# Patient Record
Sex: Male | Born: 1985 | Race: Black or African American | Hispanic: No | Marital: Single | Smoking: Current every day smoker
Health system: Southern US, Community
[De-identification: ages and names within clinical notes are randomized; demographics above are authoritative.]

## PROBLEM LIST (undated history)

## (undated) ENCOUNTER — Emergency Department (HOSPITAL_COMMUNITY): Admission: EM | Source: Home / Self Care

## (undated) DIAGNOSIS — T148XXA Other injury of unspecified body region, initial encounter: Secondary | ICD-10-CM

## (undated) DIAGNOSIS — Z86718 Personal history of other venous thrombosis and embolism: Secondary | ICD-10-CM

## (undated) DIAGNOSIS — R519 Headache, unspecified: Secondary | ICD-10-CM

## (undated) DIAGNOSIS — M109 Gout, unspecified: Secondary | ICD-10-CM

## (undated) DIAGNOSIS — I82409 Acute embolism and thrombosis of unspecified deep veins of unspecified lower extremity: Secondary | ICD-10-CM

## (undated) DIAGNOSIS — Z973 Presence of spectacles and contact lenses: Secondary | ICD-10-CM

## (undated) DIAGNOSIS — I10 Essential (primary) hypertension: Secondary | ICD-10-CM

## (undated) HISTORY — PX: WISDOM TOOTH EXTRACTION: SHX21

---

## 2020-04-10 ENCOUNTER — Other Ambulatory Visit: Payer: Self-pay

## 2020-04-10 ENCOUNTER — Ambulatory Visit (INDEPENDENT_AMBULATORY_CARE_PROVIDER_SITE_OTHER): Payer: Medicaid Other

## 2020-04-10 ENCOUNTER — Ambulatory Visit (HOSPITAL_COMMUNITY)
Admission: EM | Admit: 2020-04-10 | Discharge: 2020-04-10 | Disposition: A | Discharge status: Home / Self Care | Payer: Medicaid Other | Attending: Physician Assistant | Admitting: Physician Assistant

## 2020-04-10 ENCOUNTER — Encounter (HOSPITAL_COMMUNITY): Payer: Self-pay

## 2020-04-10 DIAGNOSIS — S6991XA Unspecified injury of right wrist, hand and finger(s), initial encounter: Secondary | ICD-10-CM | POA: Diagnosis not present

## 2020-04-10 DIAGNOSIS — S62614A Displaced fracture of proximal phalanx of right ring finger, initial encounter for closed fracture: Secondary | ICD-10-CM | POA: Diagnosis not present

## 2020-04-10 HISTORY — DX: Gout, unspecified: M10.9

## 2020-04-10 HISTORY — DX: Essential (primary) hypertension: I10

## 2020-04-10 MED ORDER — ACETAMINOPHEN 325 MG PO TABS
ORAL_TABLET | ORAL | Status: AC
  Filled 2020-04-10: qty 3

## 2020-04-10 MED ORDER — ACETAMINOPHEN 500 MG PO TABS: 1000.0000 mg | ORAL_TABLET | Freq: Three times a day (TID) | ORAL | 0 refills | Status: DC | PRN

## 2020-04-10 MED ORDER — ACETAMINOPHEN 325 MG PO TABS
975.0000 mg | ORAL_TABLET | Freq: Once | ORAL | Status: AC
  Administered 2020-04-10: 975 mg via ORAL

## 2020-04-10 MED ORDER — IBUPROFEN 600 MG PO TABS: 600.0000 mg | ORAL_TABLET | Freq: Four times a day (QID) | ORAL | 0 refills | Status: DC | PRN

## 2020-04-10 NOTE — ED Notes (Signed)
Ortho paged about splint order.

## 2020-04-10 NOTE — ED Provider Notes (Signed)
[facility]    CSN: [id number] Arrival date & time: 04/10/20  1606      History   Chief Complaint Chief Complaint  Patient presents with  . Hand Pain    HPI Jeffery Acosta is a 34 y.o. male.   Patient reports for right hand pain after punching a wall on Sunday, 4 days ago.  He reports significant swelling top of his hand and bruising on his palm.  He states he had to relocate his ring finger as he thought it was dislocated."  Look like a hanging nail".  Reports since then persistent pain.  Decreased mobility of the fingers.  Denies numbness or tingling.     Past Medical History:  Diagnosis Date  . Gout   . Hypertension     There are no problems to display for this patient.   History reviewed. No pertinent surgical history.     Home Medications    Prior to Admission medications   Medication Sig Start Date End Date Taking? Authorizing Provider  acetaminophen (TYLENOL) 500 MG tablet Take 2 tablets (1,000 mg total) by mouth every 8 (eight) hours as needed. 04/10/20   Cierria Height, Veryl Speak, PA-C  ibuprofen (ADVIL) 600 MG tablet Take 1 tablet (600 mg total) by mouth every 6 (six) hours as needed. 04/10/20   Jahne Krukowski, Veryl Speak, PA-C    Family History Family History  Family history unknown: Yes    Social History Social History   Tobacco Use  . Smoking status: Not on file  Substance Use Topics  . Alcohol use: Not on file  . Drug use: Not on file     Allergies   Patient has no known allergies.   Review of Systems Review of Systems   Physical Exam Triage Vital Signs ED Triage Vitals [04/10/20 1815]  Enc Vitals Group     BP (!) 145/114     Pulse Rate 75     Resp 16     Temp 97.8 F (36.6 C)     Temp src      SpO2 99 %     Weight      Height      Head Circumference      Peak Flow      Pain Score 6     Pain Loc      Pain Edu?      Excl. in GC?    No data found.  Updated Vital Signs BP (!) 145/114 Comment: has not had meds in "awhile  Pulse  75   Temp 97.8 F (36.6 C)   Resp 16   SpO2 99%   Visual Acuity Right Eye Distance:   Left Eye Distance:   Bilateral Distance:    Right Eye Near:   Left Eye Near:    Bilateral Near:     Physical Exam Vitals and nursing note reviewed.  Constitutional:      Appearance: Normal appearance.  Cardiovascular:     Rate and Rhythm: Normal rate.  Musculoskeletal:     Comments: Right hand with dorsal swellings more prominently over the fourth and fifth metacarpals and metacarpal heads.  Significant tenderness at the heads of the fourth and fifth metacarpals.  No obvious deformity, however possible shortening.  Patient able to slightly move fourth and fifth digits, unable to make a fist, primarily due to pain.  No significant second or third metacarpal tenderness.  Joints of the phalanges appear in line without dislocation.  Cap refill less  than 2 seconds.  Sensation is intact.  Neurological:     Mental Status: He is alert.      UC Treatments / Results  Labs (all labs ordered are listed, but only abnormal results are displayed) Labs Reviewed - No data to display  EKG   Radiology DG Hand Complete Right  Result Date: 04/10/2020 CLINICAL DATA:  34 year old male pints a wall. EXAM: RIGHT HAND - COMPLETE 3+ VIEW COMPARISON:  None. FINDINGS: There is an angulated fracture of the base of the proximal phalanx of the fourth digit with dorsal angulation of the distal fracture fragment. No other acute fracture. There is soft tissue swelling over the MCP joints. No radiopaque foreign object or soft tissue gas. IMPRESSION: Angulated fracture of the base of the proximal phalanx of the fourth digit. Electronically Signed   By: Elgie Collard M.D.   On: 04/10/2020 19:29    Procedures Procedures (including critical care time)  Medications Ordered in UC Medications  acetaminophen (TYLENOL) tablet 975 mg (975 mg Oral Given 04/10/20 1952)    Initial Impression / Assessment and Plan / UC Course    I have reviewed the triage vital signs and the nursing notes.  Pertinent labs & imaging results that were available during my care of the patient were reviewed by me and considered in my medical decision making (see chart for details).     #Displaced fracture of proximal fourth phalange on right hand Patient is 34 year old male with displaced fracture of the fourth digit on the right hand.  Neurovascularly intact.  Will place in volar resting splint and have him follow-up with hand tomorrow.  Also discussed walk-in clinic at [facility] as there the on-call hand group.  Patient verbalized understanding plan of care.  Final Clinical Impressions(s) / UC Diagnoses   Final diagnoses:  Closed displaced fracture of proximal phalanx of right ring finger, initial encounter     Discharge Instructions     Wear the splint at all times until you have follow up with the hand specialist  Call tomorrow for evaluation ASAP - there is also walk in hours 8am to 8PM at the [facility] Location  Take tylenol and ibuprofen as prescribed    ED Prescriptions    Medication Sig Dispense Auth. Provider   acetaminophen (TYLENOL) 500 MG tablet Take 2 tablets (1,000 mg total) by mouth every 8 (eight) hours as needed. 30 tablet Xareni Kelch, Veryl Speak, PA-C   ibuprofen (ADVIL) 600 MG tablet Take 1 tablet (600 mg total) by mouth every 6 (six) hours as needed. 30 tablet Irlene Crudup, Veryl Speak, PA-C     PDMP not reviewed this encounter.   Hermelinda Medicus, PA-C 04/10/20 2037

## 2020-04-10 NOTE — ED Triage Notes (Signed)
Pt c/o right hand pain after punching wall on Sunday

## 2020-04-10 NOTE — Progress Notes (Signed)
Orthopedic Tech Progress Note Patient Details:  Jeffery Acosta [birth year] [id number]  Ortho Devices Type of Ortho Device: Volar splint Ortho Device/Splint Location: RUE Ortho Device/Splint Interventions: Ordered, Application   Post Interventions Patient Tolerated: Well Instructions Provided: Care of device, Poper ambulation with device   [name] 04/10/2020, 8:14 PM

## 2020-04-10 NOTE — Discharge Instructions (Addendum)
Wear the splint at all times until you have follow up with the hand specialist  Call tomorrow for evaluation ASAP - there is also walk in hours 8am to 8PM at the [facility] Location  Take tylenol and ibuprofen as prescribed

## 2020-04-25 ENCOUNTER — Encounter (HOSPITAL_COMMUNITY): Payer: Self-pay | Admitting: Orthopedic Surgery

## 2020-04-25 ENCOUNTER — Other Ambulatory Visit: Payer: Self-pay

## 2020-04-25 NOTE — Progress Notes (Signed)
Pt stated that he has " superficial blood clots" and has not taken his Eliquis as prescribed for more that one month. Pt stated that he only takes his BP medication "  when I have a headache or when my BP is up."  Pt stated that he " does not like to take medicine."  Nurse educated patient on the potential risks when noncompliant with prescribed medications. Nurse made pt aware that he should not stop taking medications without making his doctor(s) aware. Pt made aware that if his BP is elevated on DOS, it could delay his procedure. Again, pt encouraged to take his medication as prescribed. Pt verbalized understanding.

## 2020-04-25 NOTE — Progress Notes (Signed)
Pt denies SOB, chest pain, and being under the care of a cardiologist. Pt denies having a stress test, echo and cardiac cath. Pt denies having an EKG and chest x ray. Pt denies recent labs. Pt made aware to stop taking vitamins, fish oil and herbal medications. Do not take any NSAIDs ie: Ibuprofen, Advil, Naproxen (Aleve), Motrin, BC and Goody Powder. Pt reminded to quarantine. Pt verbalized understanding of all pre-op instructions.

## 2020-04-29 ENCOUNTER — Encounter (HOSPITAL_COMMUNITY): Payer: Self-pay

## 2020-04-29 ENCOUNTER — Ambulatory Visit (HOSPITAL_COMMUNITY)
Admission: RE | Admit: 2020-04-29 | Discharge: 2020-04-29 | Disposition: A | Discharge status: Home / Self Care | Payer: Medicaid Other | Attending: Orthopedic Surgery | Admitting: Orthopedic Surgery

## 2020-04-29 ENCOUNTER — Encounter (HOSPITAL_COMMUNITY)
Admission: RE | Disposition: A | Discharge status: Home / Self Care | Payer: Self-pay | Source: Home / Self Care | Attending: Orthopedic Surgery

## 2020-04-29 ENCOUNTER — Other Ambulatory Visit: Payer: Self-pay

## 2020-04-29 ENCOUNTER — Encounter (HOSPITAL_COMMUNITY): Payer: Self-pay | Admitting: Orthopedic Surgery

## 2020-04-29 DIAGNOSIS — X58XXXA Exposure to other specified factors, initial encounter: Secondary | ICD-10-CM | POA: Insufficient documentation

## 2020-04-29 DIAGNOSIS — Z20822 Contact with and (suspected) exposure to covid-19: Secondary | ICD-10-CM | POA: Diagnosis not present

## 2020-04-29 DIAGNOSIS — S62614A Displaced fracture of proximal phalanx of right ring finger, initial encounter for closed fracture: Secondary | ICD-10-CM | POA: Insufficient documentation

## 2020-04-29 DIAGNOSIS — Z5320 Procedure and treatment not carried out because of patient's decision for unspecified reasons: Secondary | ICD-10-CM | POA: Insufficient documentation

## 2020-04-29 HISTORY — DX: Personal history of other venous thrombosis and embolism: Z86.718

## 2020-04-29 HISTORY — DX: Other injury of unspecified body region, initial encounter: T14.8XXA

## 2020-04-29 HISTORY — DX: Headache, unspecified: R51.9

## 2020-04-29 HISTORY — DX: Presence of spectacles and contact lenses: Z97.3

## 2020-04-29 LAB — CBC
HCT: 48.7 % (ref 39.0–52.0)
Hemoglobin: 16.3 g/dL (ref 13.0–17.0)
MCH: 31.4 pg (ref 26.0–34.0)
MCHC: 33.5 g/dL (ref 30.0–36.0)
MCV: 93.8 fL (ref 80.0–100.0)
Platelets: 200 10*3/uL (ref 150–400)
RBC: 5.19 MIL/uL (ref 4.22–5.81)
RDW: 13.3 % (ref 11.5–15.5)
WBC: 5.4 10*3/uL (ref 4.0–10.5)
nRBC: 0 % (ref 0.0–0.2)

## 2020-04-29 LAB — BASIC METABOLIC PANEL
Anion gap: 9 (ref 5–15)
BUN: 15 mg/dL (ref 6–20)
CO2: 25 mmol/L (ref 22–32)
Calcium: 9.3 mg/dL (ref 8.9–10.3)
Chloride: 103 mmol/L (ref 98–111)
Creatinine, Ser: 1.12 mg/dL (ref 0.61–1.24)
GFR calc Af Amer: >60 mL/min (ref >60)
GFR calc non Af Amer: >60 mL/min (ref >60)
Glucose, Bld: 92 mg/dL (ref 70–99)
Potassium: 3.6 mmol/L (ref 3.5–5.1)
Sodium: 137 mmol/L (ref 135–145)

## 2020-04-29 LAB — SARS CORONAVIRUS 2 BY RT PCR (HOSPITAL ORDER, PERFORMED IN ~~LOC~~ HOSPITAL LAB): SARS Coronavirus 2: NEGATIVE

## 2020-04-29 SURGERY — OPEN REDUCTION INTERNAL FIXATION (ORIF) PROXIMAL PHALANX
Anesthesia: Monitor Anesthesia Care | Laterality: Right

## 2020-04-29 MED ORDER — CEFAZOLIN SODIUM-DEXTROSE 2-4 GM/100ML-% IV SOLN
INTRAVENOUS | Status: AC
  Filled 2020-04-29: qty 100

## 2020-04-29 MED ORDER — PROPOFOL 10 MG/ML IV BOLUS
INTRAVENOUS | Status: AC
  Filled 2020-04-29: qty 20

## 2020-04-29 MED ORDER — FENTANYL CITRATE (PF) 250 MCG/5ML IJ SOLN
INTRAMUSCULAR | Status: AC
Start: 2020-04-29 — End: ?
  Filled 2020-04-29: qty 5

## 2020-04-29 MED ORDER — ACETAMINOPHEN 500 MG PO TABS
ORAL_TABLET | ORAL | Status: AC
  Administered 2020-04-29: 1000 mg via ORAL
  Filled 2020-04-29: qty 2

## 2020-04-29 MED ORDER — PROPOFOL 1000 MG/100ML IV EMUL
INTRAVENOUS | Status: AC
  Filled 2020-04-29: qty 100

## 2020-04-29 MED ORDER — CHLORHEXIDINE GLUCONATE 0.12 % MT SOLN
OROMUCOSAL | Status: AC
  Administered 2020-04-29: 15 mL via OROMUCOSAL
  Filled 2020-04-29: qty 15

## 2020-04-29 MED ORDER — LACTATED RINGERS IV SOLN: INTRAVENOUS | Status: DC

## 2020-04-29 MED ORDER — ORAL CARE MOUTH RINSE: 15.0000 mL | Freq: Once | OROMUCOSAL | Status: AC

## 2020-04-29 MED ORDER — ACETAMINOPHEN 500 MG PO TABS: 1000.0000 mg | ORAL_TABLET | Freq: Once | ORAL | Status: AC

## 2020-04-29 MED ORDER — MIDAZOLAM HCL 2 MG/2ML IJ SOLN
INTRAMUSCULAR | Status: AC
  Filled 2020-04-29: qty 2

## 2020-04-29 MED ORDER — CEFAZOLIN SODIUM-DEXTROSE 2-4 GM/100ML-% IV SOLN: 2.0000 g | INTRAVENOUS | Status: DC

## 2020-04-29 MED ORDER — CHLORHEXIDINE GLUCONATE 0.12 % MT SOLN: 15.0000 mL | Freq: Once | OROMUCOSAL | Status: AC

## 2020-04-29 MED ORDER — BUPIVACAINE HCL (PF) 0.25 % IJ SOLN
INTRAMUSCULAR | Status: AC
  Filled 2020-04-29: qty 30

## 2020-04-29 NOTE — Anesthesia Preprocedure Evaluation (Deleted)
Anesthesia Evaluation  Patient identified by MRN, date of birth, ID band Patient awake    Reviewed: Allergy & Precautions, NPO status , Patient's Chart, lab work & pertinent test results  Airway Mallampati: II  TM Distance: >3 FB Neck ROM: Full    Dental no notable dental hx. (+) Teeth Intact, Dental Advisory Given   Pulmonary Current Smoker,  2 cigars/d   Pulmonary exam normal breath sounds clear to auscultation       Cardiovascular hypertension, Pt. on medications Normal cardiovascular exam Rhythm:Regular Rate:Normal  Pt stated that he has " superficial blood clots" and has not taken his Eliquis as prescribed for more that one month. Pt stated that he only takes his BP medication "  when I have a headache or when my BP is up."  Took his eliquis all weekend but not today- surgeon aware   BP 154/108 in preop   Neuro/Psych  Headaches, negative psych ROS   GI/Hepatic negative GI ROS, (+)     substance abuse  alcohol use,   Endo/Other  negative endocrine ROS  Renal/GU negative Renal ROS  negative genitourinary   Musculoskeletal negative musculoskeletal ROS (+)   Abdominal   Peds  Hematology Prescribed eliquis for superficial blood clots? Per pt- does not take as prescribed    Anesthesia Other Findings Right ring finger proximal phalanx fracture  Reproductive/Obstetrics negative OB ROS                            Anesthesia Physical Anesthesia Plan  ASA: III  Anesthesia Plan: General   Post-op Pain Management:    Induction:   PONV Risk Score and Plan: 2 and Ondansetron, Dexamethasone, Midazolam and Treatment may vary due to age or medical condition  Airway Management Planned: LMA  Additional Equipment: None  Intra-op Plan:   Post-operative Plan: Extubation in OR  Informed Consent: I have reviewed the patients History and Physical, chart, labs and discussed the procedure  including the risks, benefits and alternatives for the proposed anesthesia with the patient or authorized representative who has indicated his/her understanding and acceptance.     Dental advisory given  Plan Discussed with: CRNA  Anesthesia Plan Comments:        Anesthesia Quick Evaluation

## 2020-04-29 NOTE — Progress Notes (Signed)
Pt. Reported he began taking eliquis Friday Sept. 3 and last dose was 04/28/20. Notified Dr. Salvadore Farber and Dr. Amanda Pea. No new orders.

## 2020-04-29 NOTE — Progress Notes (Signed)
Dr Amanda Pea spoke with patient.  Patient decided to cancel surgery.  OR desk notified, IV discontinue, escorted patient out to main lobby.

## 2020-05-06 ENCOUNTER — Other Ambulatory Visit: Payer: Self-pay

## 2020-05-06 ENCOUNTER — Encounter (HOSPITAL_BASED_OUTPATIENT_CLINIC_OR_DEPARTMENT_OTHER): Payer: Self-pay | Admitting: Orthopaedic Surgery

## 2020-05-06 NOTE — Progress Notes (Signed)
During completion of PAT phone interview pt stated that he "only takes his Eliquis and medications when he feels like he is getting a clot or needs it." Pt states that he has had 3-4 blood clots in legs, the last one being just a couple of months ago. Pt also stated that since he has moved here from [place] he has not established a PCP to manage Eliquis, DVTs, or HTN. The [facility] ologist of the day has left for the day. I'm leaving this chart with Boneta Lucks, RN to review with ologist in the AM to determine if cleared for surgery here at [facility]

## 2020-05-07 ENCOUNTER — Other Ambulatory Visit (HOSPITAL_COMMUNITY)
Admission: RE | Admit: 2020-05-07 | Discharge: 2020-05-07 | Disposition: A | Discharge status: Home / Self Care | Payer: Medicaid Other | Source: Ambulatory Visit | Attending: Orthopaedic Surgery | Admitting: Orthopaedic Surgery

## 2020-05-07 DIAGNOSIS — Z20822 Contact with and (suspected) exposure to covid-19: Secondary | ICD-10-CM | POA: Insufficient documentation

## 2020-05-07 DIAGNOSIS — Z01812 Encounter for preprocedural laboratory examination: Secondary | ICD-10-CM | POA: Insufficient documentation

## 2020-05-07 LAB — SARS CORONAVIRUS 2 (TAT 6-24 HRS): SARS Coronavirus 2: NEGATIVE

## 2020-05-07 NOTE — Progress Notes (Signed)
Anesthesiology Note:  Jeffery Acosta is a 34 year old male with a right ring finger proximal phalanx fracture which he suffered on 04/10/20.  He is now scheduled to undergo ORIF of the right ring finger by Dr. Roney Mans.  The patient has a history of deep vein thrombosis in 2017 and states that he was seen at [facility] in [place] on 06/12/2019 for a right lower extremity superficial thrombophlebitis.  A duplex scan on 05/19/2019 showed right lower extremity superficial vein  thrombosis but no evidence of deep vein thrombosis in either the right or left lower extremities.  He was given a prescription for a short course of Eliquis.  He also has a history of gout and hypertension and has been on Cozaar for which he  reportedly has a history of noncompliance.  He is scheduled to have a Covid test today.  I discussed the patient's medical history with Dr. Roney Mans and with the patient and I believe he is a suitable candidate for the [facility].  Kipp Brood

## 2020-05-08 ENCOUNTER — Encounter (HOSPITAL_BASED_OUTPATIENT_CLINIC_OR_DEPARTMENT_OTHER): Payer: Self-pay | Admitting: Orthopaedic Surgery

## 2020-05-08 ENCOUNTER — Encounter (HOSPITAL_BASED_OUTPATIENT_CLINIC_OR_DEPARTMENT_OTHER)
Admission: RE | Disposition: A | Discharge status: Home / Self Care | Payer: Self-pay | Source: Home / Self Care | Attending: Orthopaedic Surgery

## 2020-05-08 ENCOUNTER — Other Ambulatory Visit: Payer: Self-pay

## 2020-05-08 ENCOUNTER — Ambulatory Visit (HOSPITAL_BASED_OUTPATIENT_CLINIC_OR_DEPARTMENT_OTHER): Payer: Medicaid Other | Admitting: Anesthesiology

## 2020-05-08 ENCOUNTER — Ambulatory Visit (HOSPITAL_BASED_OUTPATIENT_CLINIC_OR_DEPARTMENT_OTHER)
Admission: RE | Admit: 2020-05-08 | Discharge: 2020-05-08 | Disposition: A | Discharge status: Home / Self Care | Payer: Medicaid Other | Attending: Orthopaedic Surgery | Admitting: Orthopaedic Surgery

## 2020-05-08 DIAGNOSIS — Z79899 Other long term (current) drug therapy: Secondary | ICD-10-CM | POA: Insufficient documentation

## 2020-05-08 DIAGNOSIS — Z7901 Long term (current) use of anticoagulants: Secondary | ICD-10-CM | POA: Diagnosis not present

## 2020-05-08 DIAGNOSIS — Z86718 Personal history of other venous thrombosis and embolism: Secondary | ICD-10-CM | POA: Insufficient documentation

## 2020-05-08 DIAGNOSIS — F17290 Nicotine dependence, other tobacco product, uncomplicated: Secondary | ICD-10-CM | POA: Insufficient documentation

## 2020-05-08 DIAGNOSIS — I10 Essential (primary) hypertension: Secondary | ICD-10-CM | POA: Insufficient documentation

## 2020-05-08 DIAGNOSIS — M109 Gout, unspecified: Secondary | ICD-10-CM | POA: Diagnosis not present

## 2020-05-08 DIAGNOSIS — S62614A Displaced fracture of proximal phalanx of right ring finger, initial encounter for closed fracture: Secondary | ICD-10-CM | POA: Diagnosis present

## 2020-05-08 HISTORY — DX: Acute embolism and thrombosis of unspecified deep veins of unspecified lower extremity: I82.409

## 2020-05-08 HISTORY — PX: OPEN REDUCTION INTERNAL FIXATION (ORIF) PROXIMAL PHALANX: SHX6235

## 2020-05-08 SURGERY — OPEN REDUCTION INTERNAL FIXATION (ORIF) PROXIMAL PHALANX
Anesthesia: Monitor Anesthesia Care | Site: Finger | Laterality: Right

## 2020-05-08 MED ORDER — OXYCODONE HCL 5 MG/5ML PO SOLN: 5.0000 mg | Freq: Once | ORAL | Status: DC | PRN

## 2020-05-08 MED ORDER — ONDANSETRON HCL 4 MG/2ML IJ SOLN
INTRAMUSCULAR | Status: AC
  Filled 2020-05-08: qty 2

## 2020-05-08 MED ORDER — LABETALOL HCL 5 MG/ML IV SOLN
INTRAVENOUS | Status: DC | PRN
  Administered 2020-05-08: 10 mg via INTRAVENOUS
  Administered 2020-05-08: 5 mg via INTRAVENOUS

## 2020-05-08 MED ORDER — MIDAZOLAM HCL 2 MG/2ML IJ SOLN
INTRAMUSCULAR | Status: AC
  Filled 2020-05-08: qty 2

## 2020-05-08 MED ORDER — CEFAZOLIN SODIUM-DEXTROSE 2-4 GM/100ML-% IV SOLN
INTRAVENOUS | Status: AC
  Filled 2020-05-08: qty 100

## 2020-05-08 MED ORDER — POVIDONE-IODINE 10 % EX SWAB
2.0000 "application " | Freq: Once | CUTANEOUS | Status: AC
  Administered 2020-05-08: 2 via TOPICAL

## 2020-05-08 MED ORDER — MIDAZOLAM HCL 2 MG/2ML IJ SOLN
2.0000 mg | Freq: Once | INTRAMUSCULAR | Status: AC
  Administered 2020-05-08: 2 mg via INTRAVENOUS

## 2020-05-08 MED ORDER — LIDOCAINE 2% (20 MG/ML) 5 ML SYRINGE
INTRAMUSCULAR | Status: AC
  Filled 2020-05-08: qty 5

## 2020-05-08 MED ORDER — LABETALOL HCL 5 MG/ML IV SOLN
INTRAVENOUS | Status: AC
  Filled 2020-05-08: qty 4

## 2020-05-08 MED ORDER — LACTATED RINGERS IV SOLN: INTRAVENOUS | Status: DC

## 2020-05-08 MED ORDER — CHLORHEXIDINE GLUCONATE 4 % EX LIQD: 60.0000 mL | Freq: Once | CUTANEOUS | Status: DC

## 2020-05-08 MED ORDER — FENTANYL CITRATE (PF) 100 MCG/2ML IJ SOLN
100.0000 ug | Freq: Once | INTRAMUSCULAR | Status: AC
  Administered 2020-05-08: 100 ug via INTRAVENOUS

## 2020-05-08 MED ORDER — MIDAZOLAM HCL 2 MG/2ML IJ SOLN
INTRAMUSCULAR | Status: DC | PRN
  Administered 2020-05-08: 2 mg via INTRAVENOUS

## 2020-05-08 MED ORDER — FENTANYL CITRATE (PF) 100 MCG/2ML IJ SOLN
INTRAMUSCULAR | Status: AC
  Filled 2020-05-08: qty 2

## 2020-05-08 MED ORDER — HYDROCODONE-ACETAMINOPHEN 5-325 MG PO TABS: 1.0000 | ORAL_TABLET | ORAL | 0 refills | Status: AC | PRN

## 2020-05-08 MED ORDER — HYDROMORPHONE HCL 1 MG/ML IJ SOLN: 0.2500 mg | INTRAMUSCULAR | Status: DC | PRN

## 2020-05-08 MED ORDER — CEFAZOLIN SODIUM-DEXTROSE 2-4 GM/100ML-% IV SOLN
2.0000 g | INTRAVENOUS | Status: AC
  Administered 2020-05-08: 2 g via INTRAVENOUS

## 2020-05-08 MED ORDER — DEXAMETHASONE SODIUM PHOSPHATE 10 MG/ML IJ SOLN
INTRAMUSCULAR | Status: DC | PRN
  Administered 2020-05-08: 5 mg via INTRAVENOUS

## 2020-05-08 MED ORDER — ONDANSETRON HCL 4 MG/2ML IJ SOLN
INTRAMUSCULAR | Status: DC | PRN
  Administered 2020-05-08: 4 mg via INTRAVENOUS

## 2020-05-08 MED ORDER — DEXAMETHASONE SODIUM PHOSPHATE 10 MG/ML IJ SOLN
INTRAMUSCULAR | Status: AC
  Filled 2020-05-08: qty 1

## 2020-05-08 MED ORDER — ROPIVACAINE HCL 5 MG/ML IJ SOLN
INTRAMUSCULAR | Status: DC | PRN
  Administered 2020-05-08: 30 mL via PERINEURAL

## 2020-05-08 MED ORDER — OXYCODONE HCL 5 MG PO TABS: 5.0000 mg | ORAL_TABLET | Freq: Once | ORAL | Status: DC | PRN

## 2020-05-08 MED ORDER — PROMETHAZINE HCL 25 MG/ML IJ SOLN: 6.2500 mg | INTRAMUSCULAR | Status: DC | PRN

## 2020-05-08 MED ORDER — LIDOCAINE HCL (CARDIAC) PF 100 MG/5ML IV SOSY
PREFILLED_SYRINGE | INTRAVENOUS | Status: DC | PRN
  Administered 2020-05-08: 60 mg via INTRATRACHEAL

## 2020-05-08 MED ORDER — PROPOFOL 500 MG/50ML IV EMUL
INTRAVENOUS | Status: DC | PRN
  Administered 2020-05-08: 100 ug/kg/min via INTRAVENOUS

## 2020-05-08 MED ORDER — DEXMEDETOMIDINE HCL 200 MCG/2ML IV SOLN
INTRAVENOUS | Status: DC | PRN
  Administered 2020-05-08: 8 ug via INTRAVENOUS
  Administered 2020-05-08: 4 ug via INTRAVENOUS
  Administered 2020-05-08: 8 ug via INTRAVENOUS

## 2020-05-08 MED ORDER — 0.9 % SODIUM CHLORIDE (POUR BTL) OPTIME
TOPICAL | Status: DC | PRN
  Administered 2020-05-08: 1000 mL

## 2020-05-08 MED ORDER — DEXMEDETOMIDINE (PRECEDEX) IN NS 20 MCG/5ML (4 MCG/ML) IV SYRINGE
PREFILLED_SYRINGE | INTRAVENOUS | Status: AC
  Filled 2020-05-08: qty 5

## 2020-05-08 SURGICAL SUPPLY — 79 items
BENZOIN TINCTURE PRP APPL 2/3 (GAUZE/BANDAGES/DRESSINGS) IMPLANT
BIT DRILL 1.1 (BIT) ×1
BIT DRILL 1.1MM (BIT) ×1
BIT DRILL 60X20X1.1XQC TMX (BIT) ×1 IMPLANT
BIT DRL 60X20X1.1XQC TMX (BIT) ×1
BLADE CLIPPER SURG (BLADE) IMPLANT
BLADE SURG 15 STRL LF DISP TIS (BLADE) ×2 IMPLANT
BLADE SURG 15 STRL SS (BLADE) ×4
BNDG CONFORM 2 STRL LF (GAUZE/BANDAGES/DRESSINGS) IMPLANT
BNDG ELASTIC 3X5.8 VLCR STR LF (GAUZE/BANDAGES/DRESSINGS) IMPLANT
BNDG ELASTIC 4X5.8 VLCR STR LF (GAUZE/BANDAGES/DRESSINGS) IMPLANT
BNDG ESMARK 4X9 LF (GAUZE/BANDAGES/DRESSINGS) ×3 IMPLANT
BNDG GAUZE ELAST 4 BULKY (GAUZE/BANDAGES/DRESSINGS) IMPLANT
BRUSH SCRUB EZ PLAIN DRY (MISCELLANEOUS) ×3 IMPLANT
CANISTER SUCT 1200ML W/VALVE (MISCELLANEOUS) ×3 IMPLANT
CLOSURE WOUND 1/2 X4 (GAUZE/BANDAGES/DRESSINGS)
CORD BIPOLAR FORCEPS 12FT (ELECTRODE) ×3 IMPLANT
COVER BACK TABLE 60X90IN (DRAPES) ×3 IMPLANT
COVER WAND RF STERILE (DRAPES) IMPLANT
CUFF TOURN SGL QUICK 18X4 (TOURNIQUET CUFF) IMPLANT
DECANTER SPIKE VIAL GLASS SM (MISCELLANEOUS) IMPLANT
DRAPE EXTREMITY T 121X128X90 (DISPOSABLE) ×3 IMPLANT
DRAPE OEC MINIVIEW 54X84 (DRAPES) ×3 IMPLANT
DRAPE SURG 17X23 STRL (DRAPES) ×3 IMPLANT
DRIVER BIT 1.5 (TRAUMA) ×6 IMPLANT
DRSG EMULSION OIL 3X3 NADH (GAUZE/BANDAGES/DRESSINGS) IMPLANT
GAUZE 4X4 16PLY RFD (DISPOSABLE) IMPLANT
GAUZE SPONGE 4X4 12PLY STRL (GAUZE/BANDAGES/DRESSINGS) ×3 IMPLANT
GAUZE XEROFORM 1X8 LF (GAUZE/BANDAGES/DRESSINGS) IMPLANT
GAUZE XEROFORM 5X9 LF (GAUZE/BANDAGES/DRESSINGS) IMPLANT
GLOVE BIOGEL PI IND STRL 8 (GLOVE) ×1 IMPLANT
GLOVE BIOGEL PI INDICATOR 8 (GLOVE) ×2
GLOVE SURG SYN 7.5  E (GLOVE)
GLOVE SURG SYN 7.5 E (GLOVE) IMPLANT
GOWN STRL REUS W/ TWL LRG LVL3 (GOWN DISPOSABLE) ×1 IMPLANT
GOWN STRL REUS W/ TWL XL LVL3 (GOWN DISPOSABLE) ×1 IMPLANT
GOWN STRL REUS W/TWL LRG LVL3 (GOWN DISPOSABLE) ×2
GOWN STRL REUS W/TWL XL LVL3 (GOWN DISPOSABLE) ×2
NEEDLE HYPO 22GX1.5 SAFETY (NEEDLE) IMPLANT
NEEDLE HYPO 25X1 1.5 SAFETY (NEEDLE) IMPLANT
NS IRRIG 1000ML POUR BTL (IV SOLUTION) ×3 IMPLANT
PACK BASIN DAY SURGERY FS (CUSTOM PROCEDURE TRAY) ×3 IMPLANT
PAD CAST 3X4 CTTN HI CHSV (CAST SUPPLIES) ×1 IMPLANT
PAD CAST 4YDX4 CTTN HI CHSV (CAST SUPPLIES) IMPLANT
PADDING CAST ABS 3INX4YD NS (CAST SUPPLIES) ×2
PADDING CAST ABS COTTON 3X4 (CAST SUPPLIES) ×1 IMPLANT
PADDING CAST COTTON 3X4 STRL (CAST SUPPLIES) ×2
PADDING CAST COTTON 4X4 STRL (CAST SUPPLIES)
PLATE T SMALL 1.5MM (Plate) ×3 IMPLANT
SCREW L 1.5X12 (Screw) ×3 IMPLANT
SCREW L 1.5X14 (Screw) ×9 IMPLANT
SCREW LOCKING 1.5X10 (Screw) ×6 IMPLANT
SCREW LOCKING 1.5X13MM (Screw) ×3 IMPLANT
SCREW NL 1.5X11 WRIST (Screw) ×3 IMPLANT
SCREW NL 1.5X12 (Screw) ×3 IMPLANT
SHEET MEDIUM DRAPE 40X70 STRL (DRAPES) ×3 IMPLANT
SPLINT FIBERGLASS 3X35 (CAST SUPPLIES) IMPLANT
SPLINT PLASTER CAST XFAST 3X15 (CAST SUPPLIES) IMPLANT
SPLINT PLASTER XTRA FASTSET 3X (CAST SUPPLIES)
STOCKINETTE SYNTHETIC 3 UNSTER (CAST SUPPLIES) IMPLANT
STRIP CLOSURE SKIN 1/2X4 (GAUZE/BANDAGES/DRESSINGS) IMPLANT
SUCTION FRAZIER HANDLE 10FR (MISCELLANEOUS)
SUCTION TUBE FRAZIER 10FR DISP (MISCELLANEOUS) IMPLANT
SUT ETHIBOND 3-0 V-5 (SUTURE) ×3 IMPLANT
SUT ETHILON 4 0 PS 2 18 (SUTURE) IMPLANT
SUT ETHILON 5 0 PS 2 18 (SUTURE) IMPLANT
SUT PROLENE 4 0 PS 2 18 (SUTURE) ×3 IMPLANT
SUT VIC AB 3-0 FS2 27 (SUTURE) IMPLANT
SUT VIC AB 4-0 P-3 18XBRD (SUTURE) IMPLANT
SUT VIC AB 4-0 P3 18 (SUTURE)
SUT VICRYL 4-0 PS2 18IN ABS (SUTURE) IMPLANT
SYR BULB EAR ULCER 3OZ GRN STR (SYRINGE) ×3 IMPLANT
SYR CONTROL 10ML LL (SYRINGE) IMPLANT
TAPE SURG TRANSPORE 1 IN (GAUZE/BANDAGES/DRESSINGS) ×1 IMPLANT
TAPE SURGICAL TRANSPORE 1 IN (GAUZE/BANDAGES/DRESSINGS) ×2
TOWEL GREEN STERILE FF (TOWEL DISPOSABLE) ×6 IMPLANT
TUBE CONNECTING 20'X1/4 (TUBING)
TUBE CONNECTING 20X1/4 (TUBING) IMPLANT
UNDERPAD 30X36 HEAVY ABSORB (UNDERPADS AND DIAPERS) ×3 IMPLANT

## 2020-05-08 NOTE — Progress Notes (Signed)
Assisted Dr. Miller with right, ultrasound guided, supraclavicular block. Side rails up, monitors on throughout procedure. See vital signs in flow sheet. Tolerated Procedure well. 

## 2020-05-08 NOTE — Op Note (Signed)
PREOPERATIVE DIAGNOSIS: Displaced and angulated right ring finger proximal phalanx fracture  POSTOPERATIVE DIAGNOSIS: Same  ATTENDING PHYSICIAN: Maudry Mayhew. Jeannie Fend, III, MD who was present and scrubbed for the entire case   ASSISTANT SURGEON: None.   ANESTHESIA: Regional with MAC  SURGICAL PROCEDURES: Open reduction and internal fixation of right ring finger proximal phalanx fracture  SURGICAL INDICATIONS: Patient is a 34 year old male who was seen and evaluated by me in clinic approximately week and a half ago.  At that time he was nearly 4 weeks out from an injury to the right hand.  He punched an object and had immediate pain and deformity of the right ring finger.  He went untreated until he presented to my clinic about a week and a half ago where he was found to have a displaced and angulated right ring finger proximal phalanx fracture.  There was nearly 40 degrees of apex volar angulation through the fracture.  Initially I discussed the case with my partner Dr. Amedeo Plenty who tried to fix the patient last week operatively but the patient canceled the case prior to proceeding.  He subsequently returned back and did wish to proceed with fixation of his finger and he was rescheduled with me this week.  We discussed proceeding forward with an open reduction and internal fixation of the fracture to correct the apex volar angulation.  After discussing the risks of surgery he did wish to proceed and presents today for that.  FINDING: Open reduction of the proximal phalanx fracture was performed.  There was abundant callus formation around the fracture.  Near-anatomic alignment of the fracture was achieved and stable fixation with a dorsal plate and screw construct.  DESCRIPTION OF PROCEDURE: Patient was identified in the preoperative holding area where the risk benefits and alternatives were once again discussed with the patient.  These risks include but are not limited to infection, bleeding, damage to  surrounding structures including blood vessels and nerves, pain, stiffness, malunion, nonunion, implant failure and need for additional procedures.  Informed consent was obtained that time the patient's right ring finger was marked with a surgical marking pen.  He then underwent a right upper extremity plexus block by anesthesia.  He was brought to the operative suite where a timeout was performed identifying the correct patient operative site.  He was positioned supine on the operative table and induced under MAC sedation.  Preoperative antibiotics were administered.  A tourniquet was placed on the upper arm and the arm was then prepped and draped in usual sterile fashion.  The limb was exsanguinated and the tourniquet was inflated.  I first attempted a closed reduction of the fracture.  With longitudinal traction through the finger there was little to no mobility through the fracture site and anatomic reduction was unable to be achieved.  At this point a longitudinal incision was then made over the dorsal aspect of the ring finger proximal phalanx.  Blunt dissection was carried down through the subcutaneous tissues elevating both radial and ulnar skin flaps.  The extensor tendon was visualized and a 15 blade was used to carefully incise its midportion creating a radial and ulnar tenderness flaps.  Deep to this the proximal phalanx was visualized.  There was abundant callus formation seen around the fracture site.  This was debrided using a rondure.  A Freer elevator was also used to help manipulate the fracture.  There is also abundant callus formation in the intramedullary canal between both the proximal and distal segments of the  fracture.  This was also thoroughly debrided to allow for full mobility of both the proximal and distal aspects of the fracture segment.  At this point manipulation of the fracture was able to be achieved in near-anatomic reduction was able to be obtained and confirmed under  fluoroscopic images.  0.045 K wire was then placed intramedullary across the fracture to help hold provisional fixation.  A Biomet 1.5 mm T-handle plate was then placed on the dorsal aspect of the of the proximal phalanx.  I was confirmed to be in appropriate position under fluoroscopic images.  This allowed for 3 screws both proximal and distal to the fracture site.  While holding the plate in appropriate position, a cortical screw was placed both proximal and distal to the fracture site reducing the plate down to the dorsal cortex of the bone.  The plate was bent to help allow for appropriate contour of the plate along the dorsal cortex.  Once appropriate, maintained near-anatomic alignment of the fracture was confirmed as well as appropriate plate and screw position, locking screws were placed both proximally and distally to the fracture site for the securing the plate and screw construct.  Fluoroscopic images were obtained to ensure appropriate screw length throughout.  At this point hand was taken through series of gentle range of motion.  There was full passive flexion and extension of the finger with no adhesions.  Additionally there was no evidence of any rotational abnormality of the digit.  The wound at this point was then copiously irrigated with normal saline.  The extensor mechanism was closed with a running 3-0 Ethibond suture.  The skin was closed with interrupted 4-0 Prolene suture.  Xeroform, 4 x 4's and a well-padded ulnar gutter splint were then placed.  The tourniquet was released and the patient had return of brisk capillary refill to all of his digits.  He was awoken from his sedation and taken to the PACU in stable condition.  He tolerated the procedure well and there were no complications.  RADIOGRAPHIC INTERPRETATION: PA and lateral fluoroscopic images were obtained Intra-Op.  These show near tonic reduction of the previously displaced and angulated ring finger proximal phalanx fracture.   There is dorsal plate and screw construct in place.  Plate and screw appear to be in appropriate position with appropriate screw lengths.  ESTIMATED BLOOD LOSS: Less than 10 mL  TOURNIQUET TIME: Approximately 50 minutes  SPECIMENS: None  POSTOPERATIVE PLAN: The patient will be discharged home and seen back  in the office in approximately 1 week for removal of his surgical dressings.  We will then get him established with outpatient occupational therapy for custom made ulnar gutter splint to allow for some early protected range of motion of the ring finger.  Provided he progresses well with range of motion he can begin strengthening at approximately 6 to 8 weeks.  IMPLANTS: Biomet 1.5 mm hand T plate

## 2020-05-08 NOTE — Anesthesia Postprocedure Evaluation (Signed)
Anesthesia Post Note  Patient: Jeffery Acosta  Procedure(s) Performed: Right ring finger proximal phalanx fracture open reduction and internal fixation (Right Finger)     Patient location during evaluation: PACU Anesthesia Type: Regional Level of consciousness: awake and alert Pain management: pain level controlled Vital Signs Assessment: post-procedure vital signs reviewed and stable Respiratory status: spontaneous breathing, nonlabored ventilation and respiratory function stable Cardiovascular status: blood pressure returned to baseline and stable Postop Assessment: no apparent nausea or vomiting Anesthetic complications: no   No complications documented.  Last Vitals:  Vitals:   05/08/20 1130 05/08/20 1148  BP: (!) 135/98 (!) 141/100  Pulse: 83 72  Resp: 16 16  Temp:  (!) 36.3 C  SpO2: 94% 98%    Last Pain:  Vitals:   05/08/20 1148  TempSrc:   PainSc: 0-No pain                 Lowella Curb

## 2020-05-08 NOTE — Transfer of Care (Signed)
Immediate Anesthesia Transfer of Care Note  Patient: Jeffery Acosta  Procedure(s) Performed: Right ring finger proximal phalanx fracture open reduction and internal fixation (Right Finger)  Patient Location: PACU  Anesthesia Type:General and Regional  Level of Consciousness: alert  and patient cooperative  Airway & Oxygen Therapy: Patient Spontanous Breathing  Post-op Assessment: Report given to RN and Post -op Vital signs reviewed and stable  Post vital signs: Reviewed and stable  Last Vitals:  Vitals Value Taken Time  BP 159/99 05/08/20 1123  Temp    Pulse 81 05/08/20 1125  Resp 24 05/08/20 1125  SpO2 94 % 05/08/20 1125  Vitals shown include unvalidated device data.  Last Pain:  Vitals:   05/08/20 0823  TempSrc: Oral  PainSc: 6          Complications: No complications documented.

## 2020-05-08 NOTE — Anesthesia Procedure Notes (Signed)
Procedure Name: MAC Date/Time: 05/08/2020 10:17 AM Performed by: Kathryne Hitch, CRNA Pre-anesthesia Checklist: Patient identified, Emergency Drugs available, Suction available and Patient being monitored Patient Re-evaluated:Patient Re-evaluated prior to induction Oxygen Delivery Method: Circle system utilized Preoxygenation: Pre-oxygenation with 100% oxygen Induction Type: IV induction Placement Confirmation: positive ETCO2 Dental Injury: Teeth and Oropharynx as per pre-operative assessment

## 2020-05-08 NOTE — Anesthesia Procedure Notes (Signed)
Anesthesia Regional Block: Supraclavicular block   Pre-Anesthetic Checklist: ,, timeout performed, Correct Patient, Correct Site, Correct Laterality, Correct Procedure, Correct Position, site marked, Risks and benefits discussed,  Surgical consent,  Pre-op evaluation,  At surgeon's request and post-op pain management  Laterality: Right  Prep: chloraprep       Needles:  Injection technique: Single-shot  Needle Type: Stimiplex     Needle Length: 9cm  Needle Gauge: 21     Additional Needles:   Procedures:,,,, ultrasound used (permanent image in chart),,,,  Narrative:  Start time: 05/08/2020 9:59 AM End time: 05/08/2020 10:04 AM Injection made incrementally with aspirations every 5 mL.  Performed by: Personally  Anesthesiologist: Lowella Curb, MD

## 2020-05-08 NOTE — Discharge Instructions (Signed)
Discharge Instructions  - Keep dressings in place. Do not remove them. - The dressings must stay dry - Take all medication as prescribed. Transition to over the counter pain medication as your pain improves - Keep the hand elevated over the next 48-72 hours to help with pain and swelling - Move all digits not restricted by the dressings regularly to prevent stiffness - Please call to schedule a follow up appointment with Dr. Roney Mans and therapy at [phone] for 7 days following surgery - Your pain medication have been sent digitally to your pharmacy   Post Anesthesia Home Care Instructions  Activity: Get plenty of rest for the remainder of the day. A responsible individual must stay with you for 24 hours following the procedure.  For the next 24 hours, DO NOT: -Drive a car -Advertising copywriter -Drink alcoholic beverages -Take any medication unless instructed by your physician -Make any legal decisions or sign important papers.  Meals: Start with liquid foods such as gelatin or soup. Progress to regular foods as tolerated. Avoid greasy, spicy, heavy foods. If nausea and/or vomiting occur, drink only clear liquids until the nausea and/or vomiting subsides. Call your physician if vomiting continues.  Special Instructions/Symptoms: Your throat may feel dry or sore from the anesthesia or the breathing tube placed in your throat during surgery. If this causes discomfort, gargle with warm salt water. The discomfort should disappear within 24 hours.  If you had a scopolamine patch placed behind your ear for the management of post- operative nausea and/or vomiting:  1. The medication in the patch is effective for 72 hours, after which it should be removed.  Wrap patch in a tissue and discard in the trash. Wash hands thoroughly with soap and water. 2. You may remove the patch earlier than 72 hours if you experience unpleasant side effects which may include dry mouth, dizziness or visual  disturbances. 3. Avoid touching the patch. Wash your hands with soap and water after contact with the patch.    Regional Anesthesia Blocks  1. Numbness or the inability to move the "blocked" extremity may last from 3-48 hours after placement. The length of time depends on the medication injected and your individual response to the medication. If the numbness is not going away after 48 hours, call your surgeon.  2. The extremity that is blocked will need to be protected until the numbness is gone and the  Strength has returned. Because you cannot feel it, you will need to take extra care to avoid injury. Because it may be weak, you may have difficulty moving it or using it. You may not know what position it is in without looking at it while the block is in effect.  3. For blocks in the legs and feet, returning to weight bearing and walking needs to be done carefully. You will need to wait until the numbness is entirely gone and the strength has returned. You should be able to move your leg and foot normally before you try and bear weight or walk. You will need someone to be with you when you first try to ensure you do not fall and possibly risk injury.  4. Bruising and tenderness at the needle site are common side effects and will resolve in a few days.  5. Persistent numbness or new problems with movement should be communicated to the surgeon or the [facility] [phone] [facility] [phone]).

## 2020-05-08 NOTE — Anesthesia Preprocedure Evaluation (Signed)
Anesthesia Evaluation  Patient identified by MRN, date of birth, ID band Patient awake    Reviewed: Allergy & Precautions, NPO status , Patient's Chart, lab work & pertinent test results  Airway Mallampati: II  TM Distance: >3 FB Neck ROM: Full    Dental no notable dental hx.    Pulmonary neg pulmonary ROS, Current Smoker and Patient abstained from smoking.,    Pulmonary exam normal breath sounds clear to auscultation       Cardiovascular hypertension, Pt. on medications negative cardio ROS Normal cardiovascular exam Rhythm:Regular Rate:Normal     Neuro/Psych  Headaches, negative psych ROS   GI/Hepatic negative GI ROS, Neg liver ROS,   Endo/Other  negative endocrine ROS  Renal/GU negative Renal ROS  negative genitourinary   Musculoskeletal negative musculoskeletal ROS (+)   Abdominal (+) + obese,   Peds negative pediatric ROS (+)  Hematology negative hematology ROS (+)   Anesthesia Other Findings   Reproductive/Obstetrics negative OB ROS                             Anesthesia Physical Anesthesia Plan  ASA: II  Anesthesia Plan: MAC and Regional   Post-op Pain Management:    Induction: Intravenous  PONV Risk Score and Plan: 0 and Treatment may vary due to age or medical condition  Airway Management Planned: Simple Face Mask  Additional Equipment:   Intra-op Plan:   Post-operative Plan:   Informed Consent: I have reviewed the patients History and Physical, chart, labs and discussed the procedure including the risks, benefits and alternatives for the proposed anesthesia with the patient or authorized representative who has indicated his/her understanding and acceptance.     Dental advisory given  Plan Discussed with: CRNA  Anesthesia Plan Comments:         Anesthesia Quick Evaluation
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
## 2020-05-12 ENCOUNTER — Encounter (HOSPITAL_BASED_OUTPATIENT_CLINIC_OR_DEPARTMENT_OTHER): Payer: Self-pay | Admitting: Orthopaedic Surgery

## 2020-06-02 ENCOUNTER — Emergency Department (HOSPITAL_BASED_OUTPATIENT_CLINIC_OR_DEPARTMENT_OTHER)
Admission: EM | Admit: 2020-06-02 | Discharge: 2020-06-02 | Disposition: A | Discharge status: Home / Self Care | Payer: Medicaid Other | Attending: Emergency Medicine | Admitting: Emergency Medicine

## 2020-06-02 ENCOUNTER — Other Ambulatory Visit: Payer: Self-pay

## 2020-06-02 ENCOUNTER — Emergency Department (HOSPITAL_BASED_OUTPATIENT_CLINIC_OR_DEPARTMENT_OTHER): Payer: Medicaid Other

## 2020-06-02 ENCOUNTER — Encounter (HOSPITAL_BASED_OUTPATIENT_CLINIC_OR_DEPARTMENT_OTHER): Payer: Self-pay

## 2020-06-02 DIAGNOSIS — S6992XA Unspecified injury of left wrist, hand and finger(s), initial encounter: Secondary | ICD-10-CM | POA: Insufficient documentation

## 2020-06-02 DIAGNOSIS — Z79899 Other long term (current) drug therapy: Secondary | ICD-10-CM | POA: Diagnosis not present

## 2020-06-02 DIAGNOSIS — F17290 Nicotine dependence, other tobacco product, uncomplicated: Secondary | ICD-10-CM | POA: Diagnosis not present

## 2020-06-02 DIAGNOSIS — I10 Essential (primary) hypertension: Secondary | ICD-10-CM | POA: Diagnosis not present

## 2020-06-02 MED ORDER — AMOXICILLIN-POT CLAVULANATE 875-125 MG PO TABS: 1.0000 | ORAL_TABLET | Freq: Two times a day (BID) | ORAL | 0 refills | Status: DC

## 2020-06-02 NOTE — ED Notes (Signed)
Noted to have two abrasion areas on back of left hand
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
## 2020-06-02 NOTE — Discharge Instructions (Signed)
Please read and follow all provided instructions.  Your diagnoses today include:  1. Injury of left hand, initial encounter     Tests performed today include:  An x-ray of the affected area - does NOT show any broken bones  Vital signs. See below for your results today.   Medications prescribed:   Augmentin - antibiotic to prevent skin infection  Take any prescribed medications only as directed.  Home care instructions:   Follow any educational materials contained in this packet  Follow R.I.C.E. Protocol:  R - rest your injury   I  - use ice on injury without applying directly to skin  C - compress injury with bandage or splint  E - elevate the injury as much as possible  Follow-up instructions: Please follow-up with your primary care provider or your orthopedic physician (bone specialist) if you continue to have significant pain in 1 week. In this case you may have a more severe injury that requires further care.   Return instructions:   Please return if your fingers are numb or tingling, appear gray or blue, or you have severe pain (also elevate the arm and loosen splint or wrap if you were given one)  Please return to the Emergency Department if you experience worsening symptoms.   Please return if you have any other emergent concerns.  Additional Information:  Your vital signs today were: BP (!) 141/104 (BP Location: Left Arm)    Pulse 89    Temp 98.3 F (36.8 C) (Oral)    Resp 18    Ht 6' (1.829 m)    Wt 113.4 kg    SpO2 99%    BMI 33.91 kg/m  If your blood pressure (BP) was elevated above 135/85 this visit, please have this repeated by your doctor within one month. --------------

## 2020-06-02 NOTE — ED Notes (Signed)
AVS and Rx x 1 provided to client along with ice pack. Reviewed DC instructions with pt and opportunity for questions provided.

## 2020-06-02 NOTE — ED Notes (Signed)
Ice pack provided to client for pain relief, also elevation to left hand initiated
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
## 2020-06-10 ENCOUNTER — Other Ambulatory Visit: Payer: Self-pay

## 2020-06-10 ENCOUNTER — Ambulatory Visit: Payer: 59 | Attending: Orthopaedic Surgery | Admitting: *Deleted

## 2020-06-10 ENCOUNTER — Encounter: Payer: Self-pay | Admitting: *Deleted

## 2020-06-10 DIAGNOSIS — M25541 Pain in joints of right hand: Secondary | ICD-10-CM | POA: Insufficient documentation

## 2020-06-10 DIAGNOSIS — M6281 Muscle weakness (generalized): Secondary | ICD-10-CM | POA: Insufficient documentation

## 2020-06-10 DIAGNOSIS — R278 Other lack of coordination: Secondary | ICD-10-CM | POA: Insufficient documentation

## 2020-06-10 DIAGNOSIS — R600 Localized edema: Secondary | ICD-10-CM | POA: Insufficient documentation

## 2020-06-10 DIAGNOSIS — M25641 Stiffness of right hand, not elsewhere classified: Secondary | ICD-10-CM | POA: Diagnosis present

## 2020-06-10 NOTE — Therapy (Signed)
[facility] [address] [place], [place], [address] Phone: [phone]   Fax:  [phone]  Occupational Therapy Evaluation  Patient Details  Name: Jeffery Acosta MRN: [id number] Date of Birth: July 30, 1986 Referring Provider (OT): Dr Roney Mans   Encounter Date: 06/10/2020   OT End of Session - 06/10/20 1139    Visit Number 1    Number of Visits 12    Date for OT Re-Evaluation 09/02/20    Authorization Type Self pay?    OT Start Time 1015    OT Stop Time 1125    OT Time Calculation (min) 70 min    Activity Tolerance Patient tolerated treatment well;No increased pain    Behavior During Therapy [facility] for tasks assessed/performed;Flat affect           Past Medical History:  Diagnosis Date  . DVT (deep venous thrombosis) (HCC)   . Fracture    angulated 4th and 5th digit  . Gout   . Headache   . Hx of blood clots    " superficial blood clots" per pt  . Hypertension   . Wears glasses     Past Surgical History:  Procedure Laterality Date  . OPEN REDUCTION INTERNAL FIXATION (ORIF) PROXIMAL PHALANX Right 05/08/2020   Procedure: Right ring finger proximal phalanx fracture open reduction and internal fixation;  Surgeon: Ernest Mallick, MD;  Location: [facility];  Service: Orthopedics;  Laterality: Right;  . WISDOM TOOTH EXTRACTION      There were no vitals filed for this visit.   Subjective Assessment - 06/10/20 1020    Subjective  Pt is a 34 y/o male following Right ring finger proximal phalanx fracture and ORIF DOS: 05/08/2020 (Note injury ocurred approximately 1 month prior to this. He is currently 4 weeks nad 5 days post-op today.    Pertinent History HTN, Headaches, h/o of blood clots, gout, DVT.    Patient Stated Goals Get movement back and use of hand again. Pt is Right hand dominant    Currently in Pain? No/denies    Pain Score 0-No pain    Multiple Pain Sites No              [facility] OT Assessment - 06/10/20 0001      Assessment   Medical Diagnosis Right RF proximal phalanx fracture ORIF    Referring Provider (OT) Dr Roney Mans    Onset Date/Surgical Date 05/08/20   DOI: around 04/07/2020   Hand Dominance Right    Next MD Visit To be scheduled per pt report      Precautions   Precautions Other (comment)   As per Proximal phalanx fracture s/p ORIF     Balance Screen   Has the patient fallen in the past 6 months No    Has the patient had a decrease in activity level because of a fear of falling?  No    Is the patient reluctant to leave their home because of a fear of falling?  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 Significant other is able to assist PRN.      Mobility   Mobility Status Independent      Written Expression   Dominant Hand Right      Vision - History   Baseline Vision Wears glasses all the time      Cognition   Overall Cognitive Status Within Functional Limits for tasks assessed      Sensation   Light Touch Appears Intact      Coordination   Gross Motor Movements are Fluid and Coordinated Yes    Fine Motor Movements are Fluid and Coordinated No   Impaired due to casting and prox phalanx fracture/ORIF R RF   Coordination Impaired      Edema   Edema Minimal edema noted right ring and small fingers as compared to left upon visual observation in the clinic today.      ROM / Strength   AROM / PROM / Strength  AROM;PROM;Strength      AROM   Overall AROM  Deficits;Unable to assess      PROM   Overall PROM  Deficits;Unable to assess      Strength   Overall Strength Deficits;Unable to assess                    OT Treatments/Exercises (OP) - 06/10/20 0001      ADLs   ADL Comments Discussed splinting use, care and precautions as well as ADL's.       Exercises   Exercises Hand      Hand Exercises   Other Hand Exercises Gentle A/ROM and AA/ROM right ring and small fingers for blocked MCP/PIP/DIP flexion, tendon gliding, full, hook and composite fist. Hourly ex's while awake x10-15 reps each Right RF/Small. Pt performed each in clinic with Min VC's and demonstration. Verbal, written instructions provided and reviewed in clinic today.      Splinting   Splinting Pt bulky dressing was removed in clinic today and he was fitted with a custom fabricated static hand based ulnar gutter splint placing his right ring and small finger MCP's in flexion and IP's extended. He was educated in splinting use, care and precautions and was noted to be able to don/doff splint in clinic setting following verbal instruction. He was educated in a HEP (see above) and educated to f/u in 1 week to 10 days for splinting check and adjustments PRN as well as upgrading of home exercise program as per protocol following Ring finger proximal phalanx fracture/ORIF on 05/08/2020. He is 4 weeks and 5 days post-op today.                 OT Education - 06/10/20 1138    Education Details Home program, scar management/massage and splinting use, care and precautions right ring/small fingers.    Person(s) Educated Patient    Methods Explanation;Demonstration;Verbal cues;Handout    Comprehension Verbalized understanding;Returned demonstration;Need further instruction            OT Short Term Goals - 06/10/20 1346      OT SHORT TERM GOAL #1   Title Pt will be Mod I splinting use, care and precautions Right.     Baseline req VC's for don/doffing    Time 4    Period Weeks  Status New    Target Date 07/08/20      OT SHORT TERM GOAL #2   Title Pt will be Mod I initial HEP R ring and dmall fingers as seen by ability to perform in clinic w/o verbal or tactile cues.    Baseline verbal/tactile cues required    Time 4    Period Weeks    Status New    Target Date 07/08/20      OT SHORT TERM GOAL #3   Title Pt will be Mod I scar management techniques and desensitixation right ring finger.    Baseline dependent    Time 4    Period Weeks    Status New    Target Date 07/08/20             OT Long Term Goals - 06/10/20 1349      OT LONG TERM GOAL #1   Title Pt will be Mod I upgraded HEP R ring and small fingers as observed in clinic setting w/o verbal or tactile cues.    Baseline Dependent    Time 8    Period Weeks    Status New    Target Date 08/05/20      OT LONG TERM GOAL #2   Title Pt will demonstrate improved active ROM R Ring Finger as seen by ability to make a flat fist and compostie fist with less than 3cm to [facility].    Baseline Unable    Time 8    Period Weeks    Status New    Target Date 08/05/20      OT LONG TERM GOAL #3   Title Pt will demonstrate imporved strength right dominant hand as seen by ability to grip 10# or greater using JAMAR dynamometer.    Baseline Unable    Time 8    Period Weeks    Status New    Target Date 08/05/20      OT LONG TERM GOAL #4   Title Pt will be Mod I simulated ADL's/functional tasks in the clinic setting using Right as dominant hand.    Baseline Occasional Min A PRN    Time 8    Period Weeks    Status New    Target Date 08/05/20      OT LONG TERM GOAL #5   Title Pt will report pain in right hand as 3/10 or less with functional activity.    Baseline Fluctuates - 0/10-6/10 per pt report at Eval 06/10/2020    Time 8    Period Weeks    Status New    Target Date 08/05/20                 Plan - 06/10/20 1140    Clinical  Impression Statement Pt is a 34 y/o male referred by Dr Roney Mans, whom presents 4 week and 5 days post-op s/p ORIF Right ring finger proximal phalanx fracture (DOS:05/08/2020, DOI = ~04/07/2020 per pt report). His PMH includes but is not limited to HTN, gout, headaches, DVT, blood clots. Please refer to pt chart for complete PMH. He should benefit from continued out-pt OT secondary to deficits in active ROM of right dominant hand/fingers, stiffness of joints, edema, need for pt education and home program, scar management and splinting . As per MD orders, Pt's bulky dressing was removed in clinic and he was fitted with a custom fabricated static hand based ulnar gutter splint placing his right ring and small finger MCP's in  flexion and IP's extended. He was educated in splinting use, care and precautions and was noted to be able to don/doff splint in clinic setting following verbal instruction. He was educated in a HEP (see above) and educated to f/u in 1 week to 10 days for splinting check and adjustments PRN as well as upgrading of home exercise program as per protocol following Ring finger proximal phalanx fracture/ORIF on 05/08/2020. He plans to call Dr Hinda Glatter office for a followup appointment as he reports that he does not currently have one.    OT Occupational Profile and History Problem Focused Assessment - Including review of records relating to presenting problem    Occupational performance deficits (Please refer to evaluation for details): ADL's;IADL's;Leisure    Body Structure / Function / Physical Skills ADL;Strength;Dexterity;Pain;Edema;UE functional use;ROM;Scar mobility;Flexibility;Coordination;Decreased knowledge of precautions;FMC    Rehab Potential Good    Clinical Decision Making Limited treatment options, no task modification necessary    Comorbidities Affecting Occupational Performance: May have comorbidities impacting occupational performance    Modification or Assistance to Complete  Evaluation  No modification of tasks or assist necessary to complete eval    OT Frequency Other (comment)   1- 2x/week for a total of 12 visits over next 8 weeks   OT Duration --   see above   OT Treatment/Interventions Self-care/ADL training;Fluidtherapy;Splinting;Therapeutic activities;Ultrasound;Therapeutic exercise;Scar mobilization;Passive range of motion;Patient/family education;Paraffin;Manual Therapy;Moist Heat    Plan Splint check and adjustments, review and upgrade HEP as per protocol R RF proximal phalanx ORIF on 05/08/2020. MD orders state may begin strengthening at 6 weeks.    Consulted and Agree with Plan of Care Patient          MP Flexion (Active)    With back of hand on table, bend large knuckles as far as they will go, keeping small joints straight. Repeat __10-15__ times. Do _hourly___ sessions per day.   Flexor Tendon Gliding (Active Full Fist)    Straighten all fingers, then make a fist, bending all joints. Repeat __10__ times. Do __hourly__ sessions per day.  Flexor Tendon Gliding (Active Straight Fist)    Start with fingers straight. Bend knuckles and middle joints. Keep fingertip joints straight to touch base of palm. Repeat _10-15___ times. Do __hourly__ sessions per day.  DIP Flexion (Active Blocked)    Hold __Ring and small____ finger firmly at the middle so that only the tip joint can bend. Hold _5___ seconds. Repeat __10-15__ times. Do __hourly while awake__ sessions per day.  PIP Flexion (Active Blocked)    Hold large knuckle straight using other hand. Bend middle joint of __ring and small____ finger as far as possible. Hold __5__ seconds. Repeat _10-15___ times. Do _hourly while awake___ sessions per day.  Wear splint between exercise sessions.  WEARING SCHEDULE:  Wear splint at ALL times except for hygiene care (May remove splint for exercises and then immediately place back on as directed by the therapist)  PURPOSE:  To prevent  movement and for protection until injury can heal  CARE OF SPLINT:  Keep splint away from heat sources including: stove, radiator or furnace, or a car in sunlight. The splint can melt and will no longer fit you properly  Keep away from pets and children  Clean the splint with rubbing alcohol as needed.  * During this time, make sure you also clean your hand/arm as instructed by your therapist and/or perform dressing changes as needed. Then dry hand/arm completely before replacing splint. (When cleaning hand/arm, keep it immobilized  in same position until splint is replaced)  PRECAUTIONS/POTENTIAL PROBLEMS: *If you notice or experience increased pain, swelling, numbness, or a lingering reddened area from the splint: Contact your therapist immediately by calling [phone]. You must wear the splint for protection, but we will get you scheduled for adjustments as quickly as possible.  (If only straps or hooks need to be replaced and NO adjustments to the splint need to be made, just call the office ahead and let them know you are coming in)  If you have any medical concerns or signs of infection, please call your doctor immediately  Patient will benefit from skilled therapeutic intervention in order to improve the following deficits and impairments:   Body Structure / Function / Physical Skills: ADL, Strength, Dexterity, Pain, Edema, UE functional use, ROM, Scar mobility, Flexibility, Coordination, Decreased knowledge of precautions, [facility]       Visit Diagnosis: Pain in joint of right hand - Plan: Ot plan of care cert/re-cert  Stiffness of right hand, not elsewhere classified - Plan: Ot plan of care cert/re-cert  Muscle weakness (generalized) - Plan: Ot plan of care cert/re-cert  Localized edema - Plan: Ot plan of care cert/re-cert  Other lack of coordination - Plan: Ot plan of care cert/re-cert    Problem List There are no problems to display for this patient.   [address], Jeffery Acosta  Beth Dixon, OTR/L 06/10/2020, 2:03 PM   [facility] [address] [place], [place], [address] Phone: [phone]   Fax:  [phone]  Name: Lewis Shockyree A Gapinski MRN: [id number] Date of Birth: 12/07/1985

## 2020-06-10 NOTE — Patient Instructions (Signed)
MP Flexion (Active)    With back of hand on table, bend large knuckles as far as they will go, keeping small joints straight. Repeat __10-15__ times. Do _hourly___ sessions per day.   Flexor Tendon Gliding (Active Full Fist)    Straighten all fingers, then make a fist, bending all joints. Repeat __10__ times. Do __hourly__ sessions per day.  Flexor Tendon Gliding (Active Straight Fist)    Start with fingers straight. Bend knuckles and middle joints. Keep fingertip joints straight to touch base of palm. Repeat _10-15___ times. Do __hourly__ sessions per day.  DIP Flexion (Active Blocked)    Hold __Ring and small____ finger firmly at the middle so that only the tip joint can bend. Hold _5___ seconds. Repeat __10-15__ times. Do __hourly while awake__ sessions per day.  PIP Flexion (Active Blocked)    Hold large knuckle straight using other hand. Bend middle joint of __ring and small____ finger as far as possible. Hold __5__ seconds. Repeat _10-15___ times. Do _hourly while awake___ sessions per day.  Wear splint between exercise sessions.  WEARING SCHEDULE:  Wear splint at ALL times except for hygiene care (May remove splint for exercises and then immediately place back on as directed by the therapist)  PURPOSE:  To prevent movement and for protection until injury can heal  CARE OF SPLINT:  Keep splint away from heat sources including: stove, radiator or furnace, or a car in sunlight. The splint can melt and will no longer fit you properly  Keep away from pets and children  Clean the splint with rubbing alcohol as needed.  * During this time, make sure you also clean your hand/arm as instructed by your therapist and/or perform dressing changes as needed. Then dry hand/arm completely before replacing splint. (When cleaning hand/arm, keep it immobilized in same position until splint is replaced)  PRECAUTIONS/POTENTIAL PROBLEMS: *If you notice or experience increased  pain, swelling, numbness, or a lingering reddened area from the splint: Contact your therapist immediately by calling [phone]. You must wear the splint for protection, but we will get you scheduled for adjustments as quickly as possible.  (If only straps or hooks need to be replaced and NO adjustments to the splint need to be made, just call the office ahead and let them know you are coming in)  If you have any medical concerns or signs of infection, please call your doctor immediately

## 2020-06-25 ENCOUNTER — Ambulatory Visit: Payer: 59 | Admitting: *Deleted

## 2020-07-02 ENCOUNTER — Encounter: Payer: Self-pay | Admitting: *Deleted

## 2020-07-02 ENCOUNTER — Other Ambulatory Visit: Payer: Self-pay

## 2020-07-02 ENCOUNTER — Ambulatory Visit: Payer: 59 | Attending: Orthopaedic Surgery | Admitting: *Deleted

## 2020-07-02 DIAGNOSIS — M25641 Stiffness of right hand, not elsewhere classified: Secondary | ICD-10-CM | POA: Diagnosis present

## 2020-07-02 DIAGNOSIS — M25541 Pain in joints of right hand: Secondary | ICD-10-CM | POA: Diagnosis not present

## 2020-07-02 DIAGNOSIS — R600 Localized edema: Secondary | ICD-10-CM | POA: Insufficient documentation

## 2020-07-02 DIAGNOSIS — R278 Other lack of coordination: Secondary | ICD-10-CM | POA: Diagnosis present

## 2020-07-02 DIAGNOSIS — M6281 Muscle weakness (generalized): Secondary | ICD-10-CM | POA: Diagnosis present

## 2020-07-02 NOTE — Patient Instructions (Signed)
Grip Strengthening (Resistive Putty)    Squeeze putty using thumb and all fingers. Repeat __15__ times. Do __1-2__ sessions per day.  Extension (Assistive Putty)    Roll putty back and forth, being sure to use all fingertips. Repeat __15__ times. Do _1-2___ sessions per day.   Wrap your hand into a fist using an ace wrap as we discussed in the clinic today. Leave on for about 10-15 min at a time to encourage finger flexion or bending.   Do these exercises after doing the others. All other exercises (issued at a previous appointment) should be 4-6 times a day.

## 2020-07-02 NOTE — Therapy (Signed)
[facility] [address] [place], [place], [address] Phone: [phone]   Fax:  [phone]  Occupational Therapy Treatment  Patient Details  Name: Jeffery Acosta MRN: [id number] Date of Birth: June 18, 1986 Referring Provider (OT): Dr Roney Mans   Encounter Date: 07/02/2020   OT End of Session - 07/02/20 1219    Visit Number 2    Number of Visits 12    Date for OT Re-Evaluation 09/02/20    Authorization Type Self pay? Ask pt is he has [employer] card    [employer] Time 1100    OT Stop Time 1145    OT Time Calculation (min) 45 min    Activity Tolerance Patient tolerated treatment well;No increased pain    Behavior During Therapy WFL for tasks assessed/performed           Past Medical History:  Diagnosis Date   DVT (deep venous thrombosis) (HCC)    Fracture    angulated 4th and 5th digit   Gout    Headache    Hx of blood clots    " superficial blood clots" per pt   Hypertension    Wears glasses     Past Surgical History:  Procedure Laterality Date   OPEN REDUCTION INTERNAL FIXATION (ORIF) PROXIMAL PHALANX Right 05/08/2020   Procedure: Right ring finger proximal phalanx fracture open reduction and internal fixation;  Surgeon: Ernest Mallick, MD;  Location: [facility];  Service: Orthopedics;  Laterality: Right;   WISDOM TOOTH EXTRACTION      There were no vitals filed for this visit.   Subjective Assessment - 07/02/20 1103    Subjective  Pt reports that he has been doing his exercises "about 3 times a day". He is wearing his splint and straps need to be replaced.    Pertinent History HTN, Headaches, h/o of blood clots, gout, DVT.    Patient Stated Goals Get movement back and use of hand again. Pt is Right hand dominant    Currently in Pain? No/denies    Pain Score 0-No pain    Multiple Pain Sites No                        OT Treatments/Exercises (OP) -  07/02/20 0001      ADLs   ADL Comments Discussed splinting use, care and precautions. Pt will begin to decrease splint use during light ADL's and light functional activity as he is currently 7 weeks and 6 days post-op today.    ADL Education Given --   Pt reports that he has been removing splint at home     Exercises   Exercises Hand;Theraputty      Hand Exercises   Other Hand Exercises A/ROM, AA/ROM and gentle P/ROM right ring and small fingers for blocked MCP/PIP/DIP flexion, tendon gliding, full, hook and composite fist. Hourly ex's while awake x10-15 reps each Right RF/Small Pt reports that he is doing HEP at least 3 times per day however he required moderate VC's and TC's to complete properly in clinic today. He may also benefit from passive fist using ace wrap as he with noted limited active PIP and DIP flexion of his right ring finger. He was with noted isolated FDS and FDP.     Other Hand Exercises Added putty today for grip and finger extension as stated below.      Theraputty   Theraputty - Roll red putty x 15  reps    Theraputty - Grip red putty x15 reps      Splinting   Splinting Splint adjustment to palmar piece and replaced strap for first web space. Pt was instructed to cont to use protective ulnar gutter splint for heavy activtiy or when in crowd etc. He will otherwise decrease use for all ADL's and light functional activity and HEP or when at rest. Pt verbalized understanding of this and stated that he had been decreasing and "not wearing at home or all the time" prior to this visit.      Manual Therapy   Manual Therapy Soft tissue mobilization;Other (comment)    Other Manual Therapy Soft tissue mobilization and scar massage to dorsal scar at right RF. Pt was educated to begin doing scar management at home 2-3 times a day to assist with increased flexibility of finger and extensor tendon gliding as well as flexion of finger.                  OT Education - 07/02/20  1218    Education Details Home program, scar management/massage, begin decreasing splint use, putty for grip and finger extension right hand.    Person(s) Educated Patient    Methods Explanation;Demonstration;Verbal cues;Handout    Comprehension Verbalized understanding;Returned demonstration;Need further instruction            OT Short Term Goals - 07/02/20 1227      OT SHORT TERM GOAL #1   Title Pt will be Mod I splinting use, care and precautions Right.    Baseline req VC's for don/doffing    Time 4    Period Weeks    Status On-going    Target Date 07/08/20      OT SHORT TERM GOAL #2   Title Pt will be Mod I initial HEP R ring and small fingers as seen by ability to perform in clinic w/o verbal or tactile cues.    Baseline Min- mod verbal/tactile cues required    Time 4    Period Weeks    Status On-going    Target Date 07/08/20      OT SHORT TERM GOAL #3   Title Pt will be Mod I scar management techniques and desensitixation right ring finger.    Baseline Min VC's/TC required    Time 4    Period Weeks    Status On-going    Target Date 07/08/20             OT Long Term Goals - 06/10/20 1349      OT LONG TERM GOAL #1   Title Pt will be Mod I upgraded HEP R ring and small fingers as observed in clinic setting w/o verbal or tactile cues.    Baseline Dependent    Time 8    Period Weeks    Status New    Target Date 08/05/20      OT LONG TERM GOAL #2   Title Pt will demonstrate improved active ROM R Ring Finger as seen by ability to make a flat fist and compostie fist with less than 3cm to [facility].    Baseline Unable    Time 8    Period Weeks    Status New    Target Date 08/05/20      OT LONG TERM GOAL #3   Title Pt will demonstrate imporved strength right dominant hand as seen by ability to grip 10# or greater using JAMAR dynamometer.    Baseline Unable  Time 8    Period Weeks    Status New    Target Date 08/05/20      OT LONG TERM GOAL #4   Title Pt  will be Mod I simulated ADL's/functional tasks in the clinic setting using Right as dominant hand.    Baseline Occasional Min A PRN    Time 8    Period Weeks    Status New    Target Date 08/05/20      OT LONG TERM GOAL #5   Title Pt will report pain in right hand as 3/10 or less with functional activity.    Baseline Fluctuates - 0/10-6/10 per pt report at Eval 06/10/2020    Time 8    Period Weeks    Status New    Target Date 08/05/20                 Plan - 07/02/20 1220    Clinical Impression Statement Pt required repeatition of HEP as previously issued. He should experience increased finger flexion with performance of HEP a minimum of 4-6 times per day. He was also issued putty for grip and finger extension right as he is currently 7 weeks and 6 days post-op. He should benefit from continued out-pt OT to address lack of terminal flexion of right ring and small fingers as well as pt education and upgrade of HEP.    OT Occupational Profile and History Problem Focused Assessment - Including review of records relating to presenting problem    Occupational performance deficits (Please refer to evaluation for details): ADL's;IADL's;Leisure    Body Structure / Function / Physical Skills ADL;Strength;Dexterity;Pain;Edema;UE functional use;ROM;Scar mobility;Flexibility;Coordination;Decreased knowledge of precautions;FMC    Rehab Potential Good    Clinical Decision Making Limited treatment options, no task modification necessary    Comorbidities Affecting Occupational Performance: May have comorbidities impacting occupational performance    Modification or Assistance to Complete Evaluation  No modification of tasks or assist necessary to complete eval    OT Frequency Other (comment)   1-2x/week x12 visits over next 8 weeks   OT Duration 8 weeks   see above   OT Treatment/Interventions Self-care/ADL training;Fluidtherapy;Splinting;Therapeutic activities;Ultrasound;Therapeutic exercise;Scar  mobilization;Passive range of motion;Patient/family education;Paraffin;Manual Therapy;Moist Heat    Plan Check to see if pt has scheduled f/u appointment with Dr Roney Mans. Focus on review/upgrade of HEP as per proximal phalanx fracture/ORIF protocol (tendon gliding, blocked flexion ex's, putty), terminal finger flexion of right RF/small. Consider fluidothreapy vs wrapping hand into flexion using coban and paraffin dip.    Recommended Other Services Pt needs to schedule follow up appointment with Dr Rolland Bimler and Agree with Plan of Care Patient          Grip Strengthening (Resistive Putty)    Squeeze putty using thumb and all fingers. Repeat __15__ times. Do __1-2__ sessions per day.  Extension (Assistive Putty)    Roll putty back and forth, being sure to use all fingertips. Repeat __15__ times. Do _1-2___ sessions per day.   Wrap your hand into a fist using an ace wrap as we discussed in the clinic today. Leave on for about 10-15 min at a time to encourage finger flexion or bending.   Do these exercises after doing the others. All other exercises (issued at a previous appointment) should be 4-6 times a day.  Patient will benefit from skilled therapeutic intervention in order to improve the following deficits and impairments:   Body Structure / Function / Physical  Skills: ADL, Strength, Dexterity, Pain, Edema, UE functional use, ROM, Scar mobility, Flexibility, Coordination, Decreased knowledge of precautions, FMC       Visit Diagnosis: Pain in joint of right hand  Stiffness of right hand, not elsewhere classified  Muscle weakness (generalized)  Localized edema  Other lack of coordination    Problem List There are no problems to display for this patient.   Mariam DollarBarnhill, Jeana Kersting Dionicio StallBeth Dixon, OTR/L 07/02/2020, 12:30 PM  [facility] [address] [place], [place], [address] Phone: [phone]   Fax:   [phone]  Name: Jeffery Acosta MRN: [id number] Date of Birth: 1985-11-24

## 2020-07-10 ENCOUNTER — Ambulatory Visit: Payer: 59 | Admitting: Occupational Therapy

## 2020-07-15 ENCOUNTER — Ambulatory Visit: Payer: 59 | Admitting: Occupational Therapy

## 2020-07-21 ENCOUNTER — Ambulatory Visit: Payer: 59 | Admitting: Occupational Therapy

## 2020-12-25 IMAGING — CR DG HAND COMPLETE 3+V*L*
3 series · 3 of 3 positions shown · non-contrast
Comparison: None.

CLINICAL DATA: Left hand pain.  Altercation

EXAM:
LEFT HAND - COMPLETE 3+ VIEW

[x hand pa left]
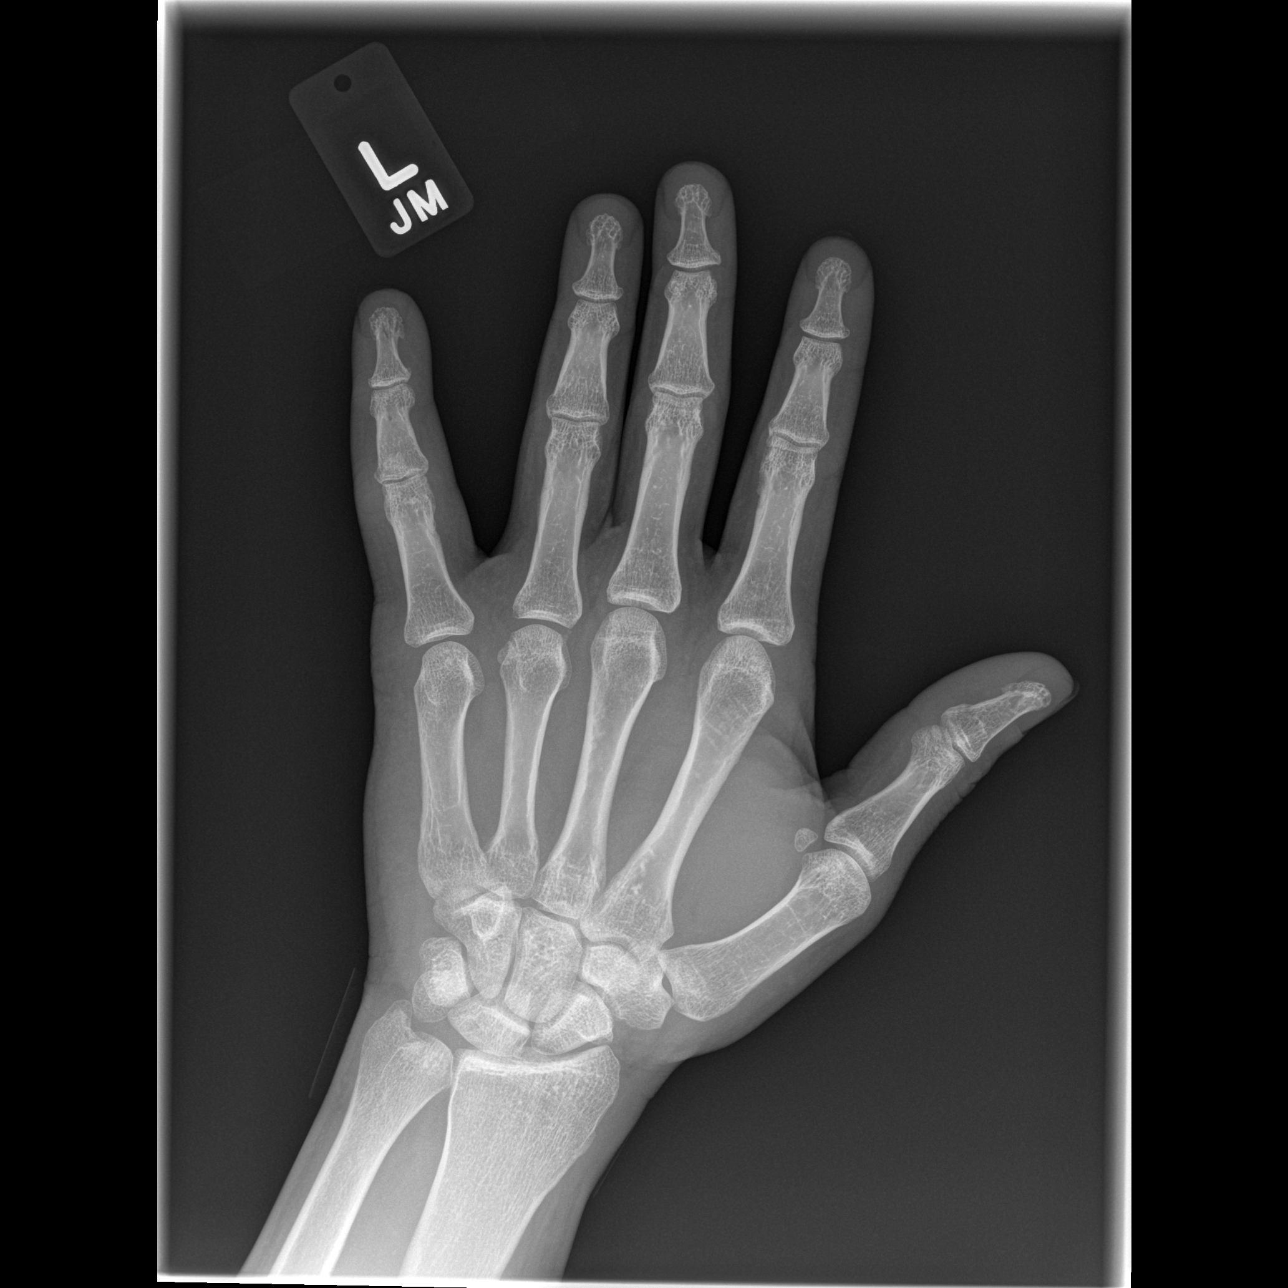

[x hand oblique left]
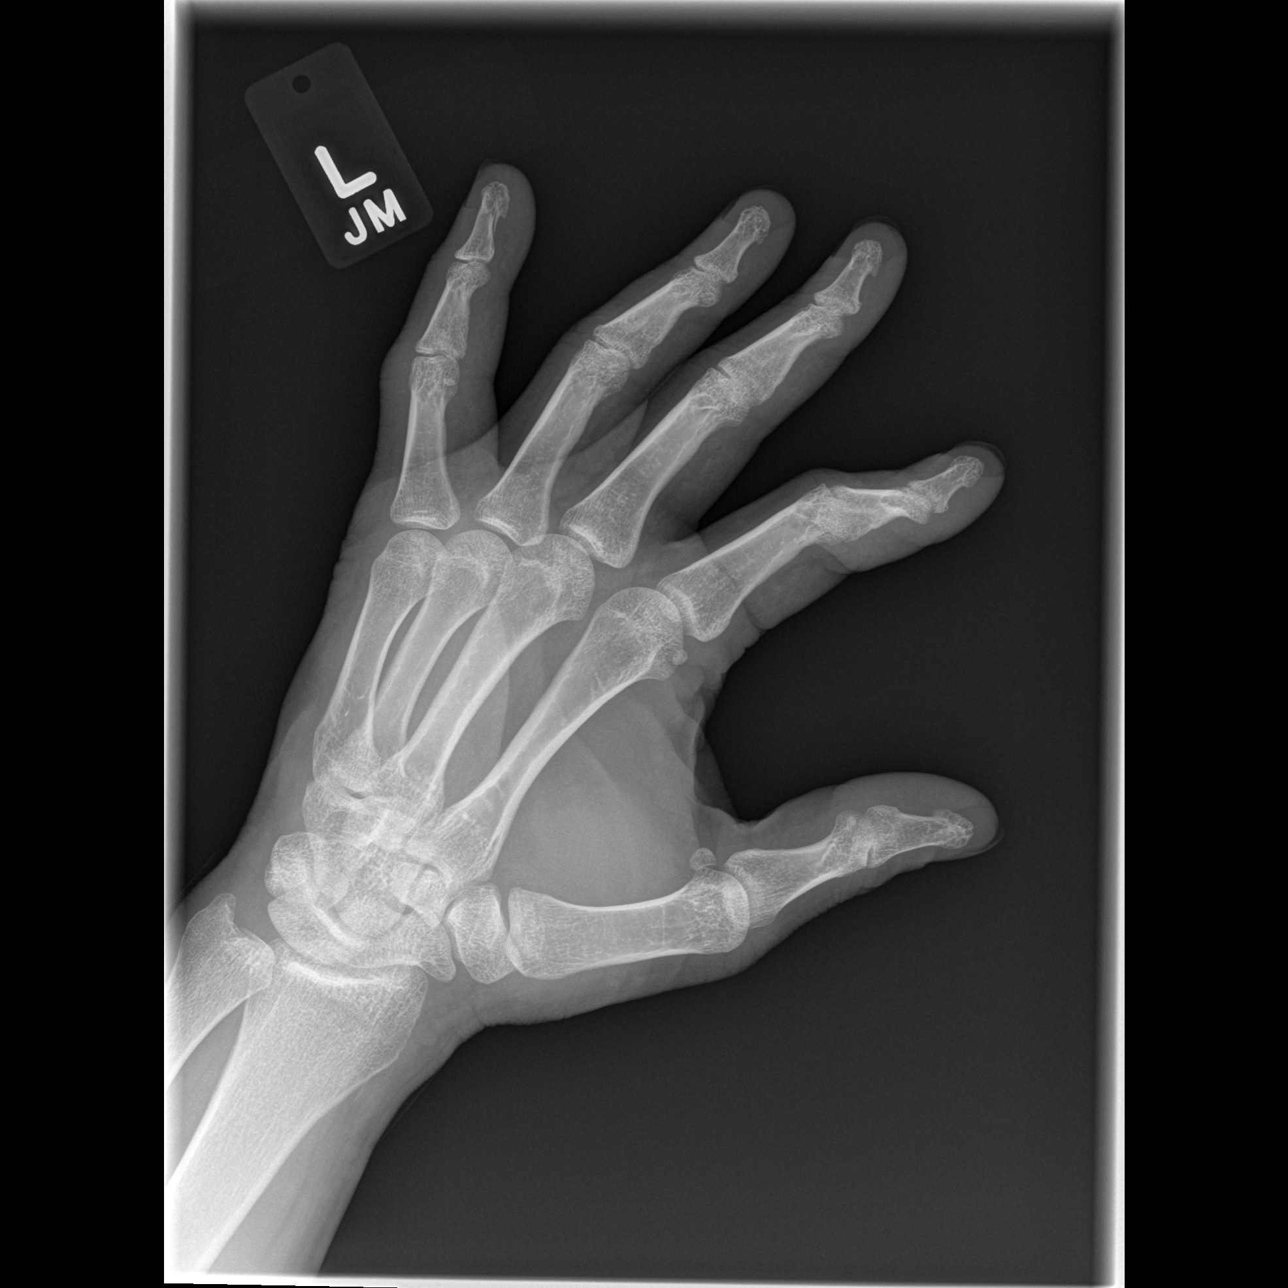

[x hand lat left]
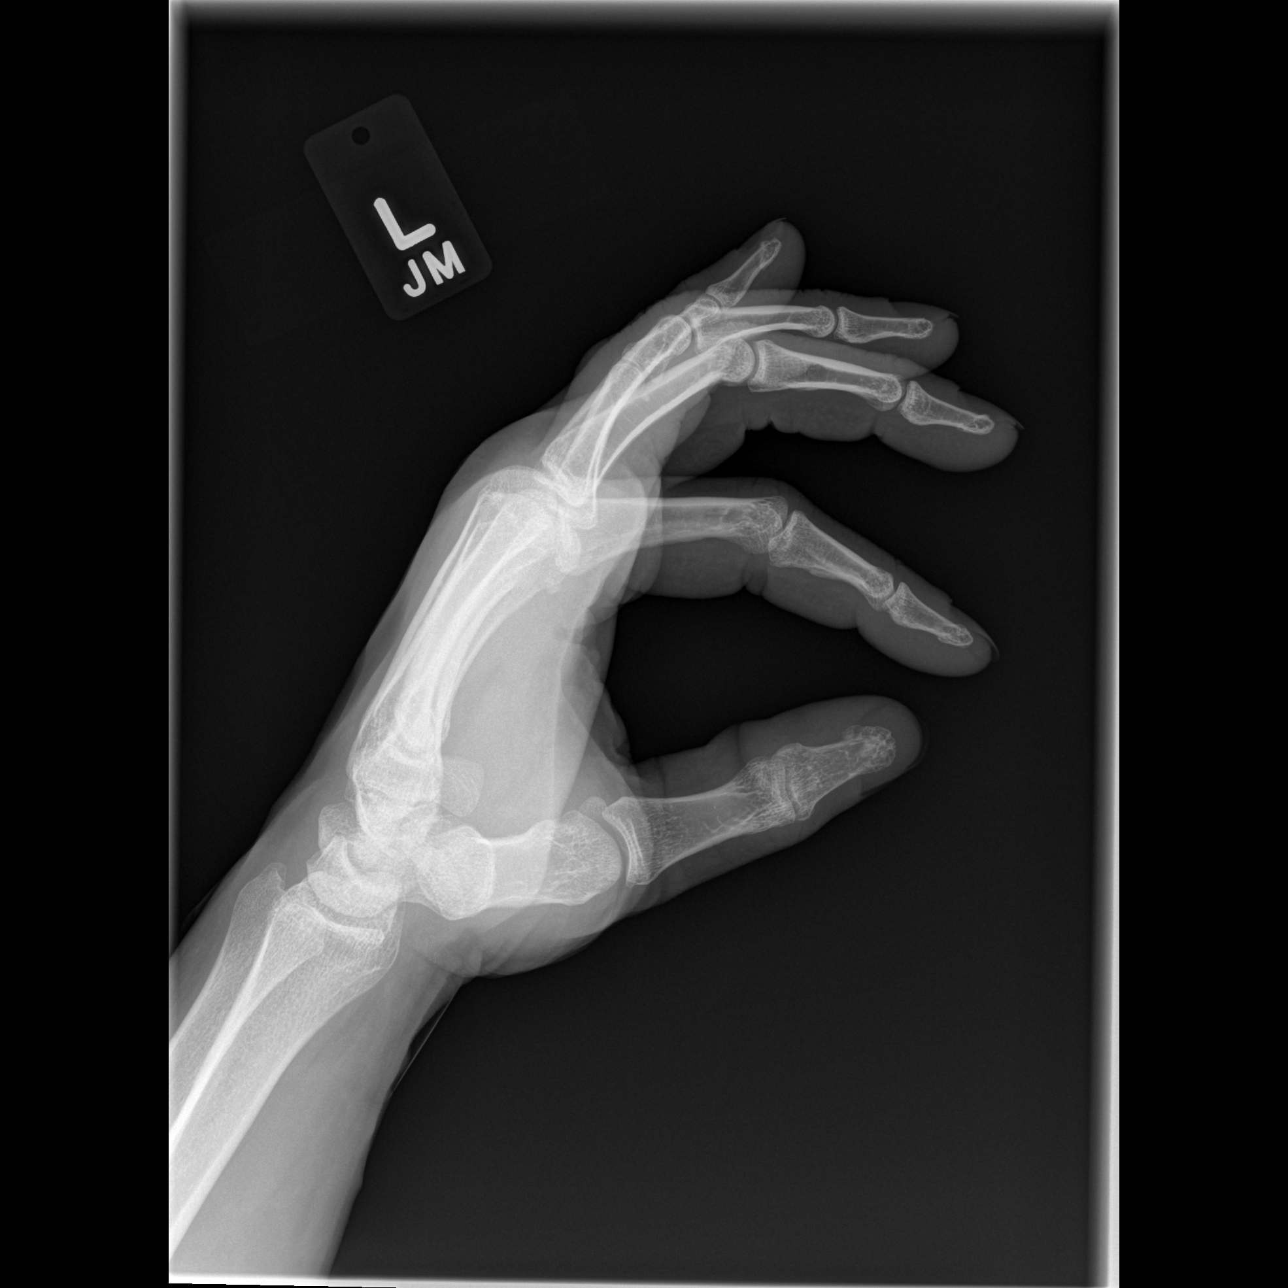

[3 of 3 positions shown; findings below may reference images not displayed]

FINDINGS: There is no evidence of fracture or dislocation. There is no
evidence of arthropathy or other focal bone abnormality. Mild soft
tissue swelling over the dorsum of the hand. No radiopaque soft
tissue foreign body.
IMPRESSION: No acute fracture or dislocation. Mild soft tissue swelling over the
dorsum of the hand.

## 2021-12-04 ENCOUNTER — Encounter (HOSPITAL_COMMUNITY): Payer: Self-pay

## 2021-12-04 ENCOUNTER — Ambulatory Visit (HOSPITAL_COMMUNITY)
Admission: EM | Admit: 2021-12-04 | Discharge: 2021-12-04 | Disposition: A | Discharge status: Home / Self Care | Payer: Medicaid Other | Attending: Internal Medicine | Admitting: Internal Medicine

## 2021-12-04 DIAGNOSIS — G43009 Migraine without aura, not intractable, without status migrainosus: Secondary | ICD-10-CM

## 2021-12-04 MED ORDER — KETOROLAC TROMETHAMINE 30 MG/ML IJ SOLN
INTRAMUSCULAR | Status: AC
  Filled 2021-12-04: qty 1

## 2021-12-04 MED ORDER — KETOROLAC TROMETHAMINE 30 MG/ML IJ SOLN
30.0000 mg | Freq: Once | INTRAMUSCULAR | Status: AC
  Administered 2021-12-04: 30 mg via INTRAMUSCULAR

## 2021-12-04 MED ORDER — LOSARTAN POTASSIUM 50 MG PO TABS: 50.0000 mg | ORAL_TABLET | Freq: Every day | ORAL | 1 refills | Status: DC

## 2021-12-04 NOTE — ED Triage Notes (Signed)
C/o migraine and elevate blood pressure. He has been out of his medication x 1 month.  ?

## 2021-12-04 NOTE — Discharge Instructions (Addendum)
Please take the medications as prescribed. ?Please return to urgent care if you develop worsening headaches. Please go to the ED if you have difficulty with your speech, extremity weakness/numbness or confusion. ?Follow up with your PCP for routine care ?

## 2021-12-07 NOTE — ED Provider Notes (Signed)
?[facility] ? ? ? ?CSN: [id number] ?Arrival date & time: 12/04/21  0848 ? ? ?  ? ?History   ?Chief Complaint ?Chief Complaint  ?Patient presents with  ? Hypertension  ? Migraine  ? ? ?HPI ?Jeffery Acosta is a 36 y.o. male with a history of hypertension comes to urgent care with headache which has been intermittent over the past couple of weeks.  Patient's headache has been persistent over the past couple of days has a visit to the urgent care.  Patient ran out of his antihypertensive medications about a month ago.  Headache is mild and not associated with any difficulty with his speech, facial numbness, weakness or extremity weakness.  No chest pain or chest pressure.  No abdominal pain.  Patient is currently fasting for this Ramadan season.  Patient's blood pressures is elevated on arrival in the urgent care.  Headache is frontal and typical for his migraine headaches.  Patient has a history of migraines. ?HPI ? ?Past Medical History:  ?Diagnosis Date  ? DVT (deep venous thrombosis) (HCC)   ? Fracture   ? angulated 4th and 5th digit  ? Gout   ? Headache   ? Hx of blood clots   ? " superficial blood clots" per pt  ? Hypertension   ? Wears glasses   ? ? ?There are no problems to display for this patient. ? ? ?Past Surgical History:  ?Procedure Laterality Date  ? OPEN REDUCTION INTERNAL FIXATION (ORIF) PROXIMAL PHALANX Right 05/08/2020  ? Procedure: Right ring finger proximal phalanx fracture open reduction and internal fixation;  Surgeon: Ernest Mallick, MD;  Location: [facility];  Service: Orthopedics;  Laterality: Right;  ? WISDOM TOOTH EXTRACTION    ? ? ? ? ? ?Home Medications   ? ?Prior to Admission medications   ?Medication Sig Start Date End Date Taking? Authorizing Provider  ?colchicine 0.6 MG tablet Take 0.6 mg by mouth daily. Pt not taking     [provider]  ?indomethacin (INDOCIN) 50 MG capsule Take 50 mg by mouth 2 (two) times daily with a meal. Pt not taking      [provider]  ?losartan (COZAAR) 50 MG tablet Take 1 tablet (50 mg total) by mouth daily. 12/04/21   Aissa Lisowski, Britta Mccreedy, MD  ? ? ?Family History ?Family History  ?Family history unknown: Yes  ? ? ?Social History ?Social History  ? ?Tobacco Use  ? Smoking status: Every Day  ?  Types: Cigars  ? Smokeless tobacco: Never  ?Vaping Use  ? Vaping Use: Never used  ?Substance Use Topics  ? Alcohol use: Yes  ?  Comment: occasional  ? ? ? ?Allergies   ?Patient has no known allergies. ? ? ?Review of Systems ?Review of Systems ?As per HPI ? ?Physical Exam ?Triage Vital Signs ?ED Triage Vitals  ?Enc Vitals Group  ?   BP 12/04/21 1022 (!) 145/103  ?   Pulse Rate 12/04/21 1022 97  ?   Resp 12/04/21 1022 16  ?   Temp 12/04/21 1022 98.3 ?F (36.8 ?C)  ?   Temp Source 12/04/21 1022 Oral  ?   SpO2 12/04/21 1022 100 %  ?   Weight --   ?   Height --   ?   Head Circumference --   ?   Peak Flow --   ?   Pain Score 12/04/21 1024 0  ?   Pain Loc --   ?  Pain Edu? --   ?   Excl. in GC? --   ? ?No data found. ? ?Updated Vital Signs ?BP (!) 154/104   Pulse 66   Temp 98.3 ?F (36.8 ?C) (Oral)   Resp 16   SpO2 100%  ? ?Visual Acuity ?Right Eye Distance:   ?Left Eye Distance:   ?Bilateral Distance:   ? ?Right Eye Near:   ?Left Eye Near:    ?Bilateral Near:    ? ?Physical Exam ?Vitals and nursing note reviewed.  ?Constitutional:   ?   General: He is not in acute distress. ?   Appearance: He is not ill-appearing.  ?HENT:  ?   Right Ear: Tympanic membrane normal.  ?   Left Ear: Tympanic membrane normal.  ?Eyes:  ?   Extraocular Movements: Extraocular movements intact.  ?   Pupils: Pupils are equal, round, and reactive to light.  ?Cardiovascular:  ?   Rate and Rhythm: Normal rate and regular rhythm.  ?   Pulses: Normal pulses.  ?   Heart sounds: Normal heart sounds.  ?Pulmonary:  ?   Effort: Pulmonary effort is normal.  ?   Breath sounds: Normal breath sounds.  ?Abdominal:  ?   General: Bowel sounds are normal.  ?   Palpations: Abdomen is  soft.  ?Musculoskeletal:     ?   General: Normal range of motion.  ?   Cervical back: Normal range of motion and neck supple. No rigidity.  ?Neurological:  ?   General: No focal deficit present.  ?   Mental Status: He is alert and oriented to person, place, and time.  ?   Cranial Nerves: No cranial nerve deficit.  ?   Sensory: No sensory deficit.  ?   Motor: No weakness.  ?   Gait: Gait normal.  ? ? ? ?UC Treatments / Results  ?Labs ?(all labs ordered are listed, but only abnormal results are displayed) ?Labs Reviewed - No data to display ? ?EKG ? ? ?Radiology ?No results found. ? ?Procedures ?Procedures (including critical care time) ? ?Medications Ordered in UC ?Medications  ?ketorolac (TORADOL) 30 MG/ML injection 30 mg (30 mg Intramuscular Given 12/04/21 1017)  ? ? ?Initial Impression / Assessment and Plan / UC Course  ?I have reviewed the triage vital signs and the nursing notes. ? ?Pertinent labs & imaging results that were available during my care of the patient were reviewed by me and considered in my medical decision making (see chart for details). ? ?  ? ?1.  Migraine headaches: ?Toradol 30 mg IM x1 dose ?Return precautions given ? ?2.  Uncontrolled hypertension secondary to medication noncompliance: ?Losartan refilled ?Return precautions given ?Medication compliance encouraged ?Final Clinical Impressions(s) / UC Diagnoses  ? ?Final diagnoses:  ?Migraine without aura and without status migrainosus, not intractable  ? ? ? ?Discharge Instructions   ? ?  ?Please take the medications as prescribed. ?Please return to urgent care if you develop worsening headaches. Please go to the ED if you have difficulty with your speech, extremity weakness/numbness or confusion. ?Follow up with your PCP for routine care ? ? ?ED Prescriptions   ? ? Medication Sig Dispense Auth. Provider  ? losartan (COZAAR) 50 MG tablet Take 1 tablet (50 mg total) by mouth daily. 60 tablet Nala Kachel, Britta Mccreedy, MD  ? ?  ? ?PDMP not reviewed this  encounter. ?  ?Merrilee Jansky, MD ?12/07/21 [id number] ? ?

## 2022-02-05 ENCOUNTER — Ambulatory Visit (HOSPITAL_COMMUNITY)
Admission: EM | Admit: 2022-02-05 | Discharge: 2022-02-05 | Disposition: A | Discharge status: Home / Self Care | Payer: Medicaid Other | Attending: Physician Assistant | Admitting: Physician Assistant

## 2022-02-05 ENCOUNTER — Encounter (HOSPITAL_COMMUNITY): Payer: Self-pay | Admitting: Emergency Medicine

## 2022-02-05 DIAGNOSIS — M109 Gout, unspecified: Secondary | ICD-10-CM | POA: Diagnosis not present

## 2022-02-05 MED ORDER — COLCHICINE 0.6 MG PO TABS: 0.6000 mg | ORAL_TABLET | Freq: Every day | ORAL | 0 refills | Status: DC

## 2022-02-05 MED ORDER — PREDNISONE 50 MG PO TABS: ORAL_TABLET | ORAL | 0 refills | Status: DC

## 2022-02-05 NOTE — ED Provider Notes (Addendum)
[facility]    CSN: [id number] Arrival date & time: 02/05/22  2536      History   Chief Complaint Chief Complaint  Patient presents with   Gout    HPI Jeffery Acosta is a 36 y.o. male.   Pt complains of gout.  Pt reports it flaired up after eating shrimp.  Pt has had gout in the past.    The history is provided by the patient. No language interpreter was used.    Past Medical History:  Diagnosis Date   DVT (deep venous thrombosis) (HCC)    Fracture    angulated 4th and 5th digit   Gout    Headache    Hx of blood clots    " superficial blood clots" per pt   Hypertension    Wears glasses     There are no problems to display for this patient.   Past Surgical History:  Procedure Laterality Date   OPEN REDUCTION INTERNAL FIXATION (ORIF) PROXIMAL PHALANX Right 05/08/2020   Procedure: Right ring finger proximal phalanx fracture open reduction and internal fixation;  Surgeon: Ernest Mallick, MD;  Location: [facility];  Service: Orthopedics;  Laterality: Right;   WISDOM TOOTH EXTRACTION         Home Medications    Prior to Admission medications   Medication Sig Start Date End Date Taking? Authorizing Provider  losartan (COZAAR) 50 MG tablet Take 1 tablet (50 mg total) by mouth daily. 12/04/21  Yes LampteyBritta Mccreedy, MD  predniSONE (DELTASONE) 50 MG tablet One tablet  a day 02/05/22  Yes Elson Areas, PA-C  colchicine 0.6 MG tablet Take 1 tablet (0.6 mg total) by mouth daily. Pt not taking 02/05/22   Elson Areas, PA-C  indomethacin (INDOCIN) 50 MG capsule Take 50 mg by mouth 2 (two) times daily with a meal. Pt not taking     [provider]    Family History Family History  Family history unknown: Yes    Social History Social History   Tobacco Use   Smoking status: Every Day    Types: Cigars   Smokeless tobacco: Never  Vaping Use   Vaping Use: Never used  Substance Use Topics   Alcohol use: Yes    Comment:  occasional     Allergies   Patient has no known allergies.   Review of Systems Review of Systems  All other systems reviewed and are negative.    Physical Exam Triage Vital Signs ED Triage Vitals  Enc Vitals Group     BP 02/05/22 0932 (!) 133/98     Pulse Rate 02/05/22 0932 83     Resp 02/05/22 0932 16     Temp 02/05/22 0932 98.3 F (36.8 C)     Temp Source 02/05/22 0932 Oral     SpO2 02/05/22 0932 95 %     Weight --      Height --      Head Circumference --      Peak Flow --      Pain Score 02/05/22 0935 8     Pain Loc --      Pain Edu? --      Excl. in GC? --    No data found.  Updated Vital Signs BP (!) 133/98 (BP Location: Right Arm)   Pulse 83   Temp 98.3 F (36.8 C) (Oral)   Resp 16   SpO2 95%   Visual Acuity Right  Eye Distance:   Left Eye Distance:   Bilateral Distance:    Right Eye Near:   Left Eye Near:    Bilateral Near:     Physical Exam Vitals reviewed.  Constitutional:      Appearance: Normal appearance.  Musculoskeletal:        General: Swelling and tenderness present.     Comments: Swollen tender left 1st toe and metatarsal nv and ns intact  Skin:    General: Skin is warm.  Neurological:     General: No focal deficit present.     Mental Status: He is alert.  Psychiatric:        Mood and Affect: Mood normal.      UC Treatments / Results  Labs (all labs ordered are listed, but only abnormal results are displayed) Labs Reviewed - No data to display  EKG   Radiology No results found.  Procedures Procedures (including critical care time)  Medications Ordered in UC Medications - No data to display  Initial Impression / Assessment and Plan / UC Course  I have reviewed the triage vital signs and the nursing notes.  Pertinent labs & imaging results that were available during my care of the patient were reviewed by me and considered in my medical decision making (see chart for details).      Final Clinical  Impressions(s) / UC Diagnoses   Final diagnoses:  Acute gout involving toe of left foot, unspecified cause   Discharge Instructions   None    ED Prescriptions     Medication Sig Dispense Auth. Provider   colchicine 0.6 MG tablet Take 1 tablet (0.6 mg total) by mouth daily. Pt not taking 30 tablet Wallace Cogliano K, PA-C   predniSONE (DELTASONE) 50 MG tablet One tablet  a day 6 tablet Elson Areas, [place]      PDMP not reviewed this encounter. An After Visit Summary was printed and given to the patient.    Elson Areas, PA-C 02/05/22 1000    Elson Areas, [place] 02/05/22 1001

## 2022-02-05 NOTE — ED Triage Notes (Signed)
Patient c/o gout flare x 1 month.   Patient endorses pain of RT greater toe.   Patient endorses pain has worsened.   Patient endorses swelling.   Patient has Aleve with no relief of symptoms.   History of Gout per patient statement.

## 2022-09-06 ENCOUNTER — Ambulatory Visit (HOSPITAL_BASED_OUTPATIENT_CLINIC_OR_DEPARTMENT_OTHER)
Admit: 2022-09-06 | Discharge: 2022-09-06 | Disposition: A | Discharge status: Home / Self Care | Payer: BLUE CROSS/BLUE SHIELD | Attending: Family Medicine | Admitting: Family Medicine

## 2022-09-06 ENCOUNTER — Ambulatory Visit (HOSPITAL_COMMUNITY)
Admission: EM | Admit: 2022-09-06 | Discharge: 2022-09-06 | Disposition: A | Discharge status: Home / Self Care | Payer: BLUE CROSS/BLUE SHIELD | Attending: Family Medicine | Admitting: Family Medicine

## 2022-09-06 ENCOUNTER — Encounter (HOSPITAL_COMMUNITY): Payer: Self-pay | Admitting: Emergency Medicine

## 2022-09-06 DIAGNOSIS — R52 Pain, unspecified: Secondary | ICD-10-CM

## 2022-09-06 DIAGNOSIS — M7989 Other specified soft tissue disorders: Secondary | ICD-10-CM

## 2022-09-06 DIAGNOSIS — M25571 Pain in right ankle and joints of right foot: Secondary | ICD-10-CM | POA: Diagnosis present

## 2022-09-06 MED ORDER — COLCHICINE 0.6 MG PO TABS: 0.6000 mg | ORAL_TABLET | Freq: Every day | ORAL | 0 refills | Status: DC

## 2022-09-06 MED ORDER — INDOMETHACIN 50 MG PO CAPS: 50.0000 mg | ORAL_CAPSULE | Freq: Two times a day (BID) | ORAL | 0 refills | Status: DC

## 2022-09-06 MED ORDER — PREDNISONE 50 MG PO TABS: ORAL_TABLET | ORAL | 0 refills | Status: DC

## 2022-09-06 NOTE — ED Provider Notes (Addendum)
[facility]    CSN: [id number] Arrival date & time: 09/06/22  5462      History   Chief Complaint Chief Complaint  Patient presents with   Leg Swelling    HPI Jeffery Acosta is a 37 y.o. male.   Patient presents today with right anterior leg pain with swelling that began to big toe approximately 1 week ago.  Patient states that he has a history of gout and also DVTs.  Patient moved from [place] and has not seen a PCP here.  Patient is on Eliquis but has not been on it over 1 year.  Patient denies any shortness of breath no chest pain no fever.    Past Medical History:  Diagnosis Date   DVT (deep venous thrombosis) (HCC)    Fracture    angulated 4th and 5th digit   Gout    Headache    Hx of blood clots    " superficial blood clots" per pt   Hypertension    Wears glasses     There are no problems to display for this patient.   Past Surgical History:  Procedure Laterality Date   OPEN REDUCTION INTERNAL FIXATION (ORIF) PROXIMAL PHALANX Right 05/08/2020   Procedure: Right ring finger proximal phalanx fracture open reduction and internal fixation;  Surgeon: Ernest Mallick, MD;  Location: [facility];  Service: Orthopedics;  Laterality: Right;   WISDOM TOOTH EXTRACTION         Home Medications    Prior to Admission medications   Medication Sig Start Date End Date Taking? Authorizing Provider  colchicine 0.6 MG tablet Take 1 tablet (0.6 mg total) by mouth daily. 09/06/22  Yes Coralyn Mark, NP  losartan (COZAAR) 50 MG tablet Take 1 tablet (50 mg total) by mouth daily. 12/04/21  Yes Lamptey, Britta Mccreedy, MD  colchicine 0.6 MG tablet Take 1 tablet (0.6 mg total) by mouth daily. Pt not taking 09/06/22   Coralyn Mark, NP  indomethacin (INDOCIN) 50 MG capsule Take 1 capsule (50 mg total) by mouth 2 (two) times daily with a meal. Pt not taking 09/06/22   Coralyn Mark, NP  predniSONE (DELTASONE) 50 MG tablet One tablet  a day  09/06/22   Coralyn Mark, NP    Family History Family History  Family history unknown: Yes    Social History Social History   Tobacco Use   Smoking status: Every Day    Types: Cigars   Smokeless tobacco: Never  Vaping Use   Vaping Use: Never used  Substance Use Topics   Alcohol use: Yes    Comment: occasional     Allergies   Patient has no known allergies.   Review of Systems Review of Systems  Constitutional: Negative.   Respiratory: Negative.    Cardiovascular: Negative.   Gastrointestinal: Negative.   Genitourinary: Negative.   Musculoskeletal:  Positive for joint swelling.       Right foot and anterior leg pain.  Moderate amount of swelling to the foot that began in the right toe.  Skin: Negative.   Neurological: Negative.      Physical Exam Triage Vital Signs ED Triage Vitals  Enc Vitals Group     BP 09/06/22 0851 (!) 159/114     Pulse Rate 09/06/22 0851 94     Resp 09/06/22 0851 16     Temp 09/06/22 0851 98.2 F (36.8 C)     Temp Source 09/06/22 0851  Oral     SpO2 09/06/22 0851 95 %     Weight --      Height --      Head Circumference --      Peak Flow --      Pain Score 09/06/22 0856 7     Pain Loc --      Pain Edu? --      Excl. in GC? --    No data found.  Updated Vital Signs BP (!) 159/114 (BP Location: Left Arm)   Pulse 94   Temp 98.2 F (36.8 C) (Oral)   Resp 16   SpO2 95%   Visual Acuity Right Eye Distance:   Left Eye Distance:   Bilateral Distance:    Right Eye Near:   Left Eye Near:    Bilateral Near:     Physical Exam Constitutional:      Appearance: Normal appearance.  Cardiovascular:     Rate and Rhythm: Normal rate.     Pulses: Normal pulses.  Pulmonary:     Effort: Pulmonary effort is normal.  Abdominal:     General: Abdomen is flat.  Musculoskeletal:        General: Swelling and tenderness present. No deformity or signs of injury.     Right lower leg: Edema present.     Left lower leg: No edema.   Skin:    Capillary Refill: Capillary refill takes 2 to 3 seconds.     Comments: Right lower extremity warm to palpation +2 pitting edema.  Painful to touch negative Homans.  Patient has mild pelvic pain on palpation at anterior area more than the posterior.  Palpated +2 pedal pulses  Neurological:     General: No focal deficit present.     Mental Status: He is alert. Mental status is at baseline.      UC Treatments / Results  Labs (all labs ordered are listed, but only abnormal results are displayed) Labs Reviewed - No data to display  EKG   Radiology LE VENOUS  Result Date: 09/06/2022  Lower Venous DVT Study Patient Name:  Jeffery Acosta  Date of Exam:   09/06/2022 Medical Rec #: [id number]     Accession #:    [id number] Date of Birth: 1985-12-21    Patient Gender: M Patient Age:   19 years Exam Location:  [facility] Procedure:      VAS [place] LOWER EXTREMITY VENOUS (DVT) Referring Phys: Arlys John HAGLER --------------------------------------------------------------------------------  Indications: Pain, Swelling, and Right leg and foot pain 1 week. History of gout and superficial vein thrombosis.  Comparison Study: No priors. Performing Technologist: Marilynne Halsted RDMS, RVT  Examination Guidelines: A complete evaluation includes B-mode imaging, spectral Doppler, color Doppler, and power Doppler as needed of all accessible portions of each vessel. Bilateral testing is considered an integral part of a complete examination. Limited examinations for reoccurring indications may be performed as noted. The reflux portion of the exam is performed with the patient in reverse Trendelenburg.  +---------+---------------+---------+-----------+----------+--------------+ RIGHT    CompressibilityPhasicitySpontaneityPropertiesThrombus Aging +---------+---------------+---------+-----------+----------+--------------+ CFV      Full           Yes      Yes                                  +---------+---------------+---------+-----------+----------+--------------+ SFJ      Full                                                        +---------+---------------+---------+-----------+----------+--------------+  FV Prox  Full                                                        +---------+---------------+---------+-----------+----------+--------------+ FV Mid   Full                                                        +---------+---------------+---------+-----------+----------+--------------+ FV DistalFull                                                        +---------+---------------+---------+-----------+----------+--------------+ PFV      Full                                                        +---------+---------------+---------+-----------+----------+--------------+ POP      Full           Yes      Yes                                 +---------+---------------+---------+-----------+----------+--------------+ PTV      Full                                                        +---------+---------------+---------+-----------+----------+--------------+ PERO     Full                                                        +---------+---------------+---------+-----------+----------+--------------+   +----+---------------+---------+-----------+----------+--------------+ LEFTCompressibilityPhasicitySpontaneityPropertiesThrombus Aging +----+---------------+---------+-----------+----------+--------------+ CFV Full           Yes      Yes                                 +----+---------------+---------+-----------+----------+--------------+ SFJ Full                                                        +----+---------------+---------+-----------+----------+--------------+    Summary: RIGHT: - There is no evidence of deep vein thrombosis in the lower extremity. - There is no evidence of superficial venous thrombosis.  - No  cystic structure found in the popliteal fossa.  LEFT: - No evidence of common femoral vein obstruction.  *See table(s) above for measurements and observations.    Preliminary  Procedures Procedures (including critical care time)  Medications Ordered in UC Medications - No data to display  Initial Impression / Assessment and Plan / UC Course  I have reviewed the triage vital signs and the nursing notes.  Pertinent labs & imaging results that were available during my care of the patient were reviewed by me and considered in my medical decision making (see chart for details).     Discussed with patient that he will need to have an ultrasound completed outpatient over [facility] today to rule out a DVT especially due to patient's history of having clots prior.  Patient will need to call cardiology to set up an appointment and also have follow-up to start back on Eliquis.  Discussed the importance with patient being on blood thinners to prevent clots Also placed an order for patient to be called for a new PCP Will send in medication for gout but expressed to patient we will need to rule out the DVT first.  Patient understands the discharge instructions. Final Clinical Impressions(s) / UC Diagnoses   Final diagnoses:  Leg swelling  Pain in joint involving right ankle and foot     Discharge Instructions      We have placed an order for you to go over to [facility] to have an ultrasound completed to rule out DVT We will prescribe a Medication for you but you will need to follow-up with cardiology and a PCP Will prescribe gout medication for patient but he will need to rule out a clot prior to taking.      ED Prescriptions     Medication Sig Dispense Auth. Provider   colchicine 0.6 MG tablet  (Status: Discontinued) Take 1 tablet (0.6 mg total) by mouth daily. Pt not taking 30 tablet Morley Kos L, NP   predniSONE (DELTASONE) 50 MG tablet One tablet  a day 6 tablet  Morley Kos L, NP   indomethacin (INDOCIN) 50 MG capsule Take 1 capsule (50 mg total) by mouth 2 (two) times daily with a meal. Pt not taking 30 capsule Morley Kos L, NP   colchicine 0.6 MG tablet Take 1 tablet (0.6 mg total) by mouth daily. Pt not taking 30 tablet Morley Kos L, NP   colchicine 0.6 MG tablet Take 1 tablet (0.6 mg total) by mouth daily. 30 tablet Marney Setting, NP      PDMP not reviewed this encounter.   Marney Setting, NP 09/06/22 1132    Marney Setting, NP 09/06/22 1133

## 2022-09-06 NOTE — Discharge Instructions (Addendum)
We have placed an order for you to go over to [facility] to have an ultrasound completed to rule out DVT We will prescribe a Medication for you but you will need to follow-up with cardiology and a PCP Will prescribe gout medication for patient but he will need to rule out a clot prior to taking.

## 2022-09-06 NOTE — ED Triage Notes (Addendum)
Right leg and foot pain x 1 week. Hx of gout and superficial blood clots. Patient states he has had several blood clots throughout his life and that he usually takes Eliquis for this, but doesn't have any. Denies fever, cough, SOB

## 2022-11-01 ENCOUNTER — Ambulatory Visit (HOSPITAL_COMMUNITY)
Admission: EM | Admit: 2022-11-01 | Discharge: 2022-11-01 | Disposition: A | Discharge status: Home / Self Care | Payer: BLUE CROSS/BLUE SHIELD | Attending: Nurse Practitioner | Admitting: Nurse Practitioner

## 2022-11-01 ENCOUNTER — Encounter (HOSPITAL_COMMUNITY): Payer: Self-pay

## 2022-11-01 DIAGNOSIS — Z8739 Personal history of other diseases of the musculoskeletal system and connective tissue: Secondary | ICD-10-CM | POA: Diagnosis not present

## 2022-11-01 DIAGNOSIS — Z760 Encounter for issue of repeat prescription: Secondary | ICD-10-CM

## 2022-11-01 DIAGNOSIS — R519 Headache, unspecified: Secondary | ICD-10-CM | POA: Diagnosis not present

## 2022-11-01 DIAGNOSIS — I10 Essential (primary) hypertension: Secondary | ICD-10-CM

## 2022-11-01 MED ORDER — LOSARTAN POTASSIUM 50 MG PO TABS: 50.0000 mg | ORAL_TABLET | Freq: Every day | ORAL | 0 refills | Status: AC

## 2022-11-01 NOTE — ED Provider Notes (Signed)
[facility]    CSN: [id number] Arrival date & time: 11/01/22  1140      History   Chief Complaint Chief Complaint  Patient presents with   Hypertension   Medication Refill   Headache   Foot Pain    HPI Jeffery Acosta is a 37 y.o. male.   Patient presents today for headache and high blood pressure.  Reports he ran out of his blood pressure medication a couple of weeks ago that is around when the headache began.  Has taken over-the-counter NSAIDs for headache without improvement.  Reports the pain is across his head and is moderate in intensity.  No double vision, blurred vision, chest pain, shortness of breath, dizziness or lightheadedness currently.  No lower extremity swelling.  Reports he also feels like he may be starting to get a gout flare and is requesting refills of both losartan and gout medication.  Reports gout alternates between right and left great toes.  No current swelling, redness, or difficulty wearing shoes.  Reports he recently moved from [place] and has not establish care with a primary provider so far.   Past Medical History:  Diagnosis Date   DVT (deep venous thrombosis) (HCC)    Fracture    angulated 4th and 5th digit   Gout    Headache    Hx of blood clots    " superficial blood clots" per pt   Hypertension    Wears glasses     There are no problems to display for this patient.   Past Surgical History:  Procedure Laterality Date   OPEN REDUCTION INTERNAL FIXATION (ORIF) PROXIMAL PHALANX Right 05/08/2020   Procedure: Right ring finger proximal phalanx fracture open reduction and internal fixation;  Surgeon: Verner Mould, MD;  Location: [facility];  Service: Orthopedics;  Laterality: Right;   WISDOM TOOTH EXTRACTION         Home Medications    Prior to Admission medications   Medication Sig Start Date End Date Taking? Authorizing Provider  losartan (COZAAR) 50 MG tablet Take 1 tablet (50 mg total) by mouth  daily. 11/01/22   Eulogio Bear, NP    Family History Family History  Family history unknown: Yes    Social History Social History   Tobacco Use   Smoking status: Every Day    Types: Cigars   Smokeless tobacco: Never  Vaping Use   Vaping Use: Every day   Substances: Nicotine, Flavoring  Substance Use Topics   Alcohol use: Yes    Comment: occasional   Drug use: Yes    Types: Marijuana     Allergies   Patient has no known allergies.   Review of Systems Review of Systems Per HPI  Physical Exam Triage Vital Signs ED Triage Vitals  Enc Vitals Group     BP 11/01/22 1307 (!) 161/107     Pulse Rate 11/01/22 1307 85     Resp 11/01/22 1307 16     Temp 11/01/22 1307 98.3 F (36.8 C)     Temp Source 11/01/22 1307 Oral     SpO2 11/01/22 1307 96 %     Weight --      Height --      Head Circumference --      Peak Flow --      Pain Score 11/01/22 1308 9     Pain Loc --      Pain Edu? --  Excl. in GC? --    No data found.  Updated Vital Signs BP (!) 161/107 (BP Location: Left Arm)   Pulse 85   Temp 98.3 F (36.8 C) (Oral)   Resp 16   SpO2 96%   Visual Acuity Right Eye Distance:   Left Eye Distance:   Bilateral Distance:    Right Eye Near:   Left Eye Near:    Bilateral Near:     Physical Exam Vitals and nursing note reviewed.  Constitutional:      General: He is not in acute distress.    Appearance: He is well-developed. He is not toxic-appearing.  HENT:     Head: Normocephalic and atraumatic.  Eyes:     General: No scleral icterus.    Extraocular Movements: Extraocular movements intact.     Right eye: Normal extraocular motion.     Left eye: Normal extraocular motion.     Pupils: Pupils are equal, round, and reactive to light. Pupils are equal.  Cardiovascular:     Rate and Rhythm: Normal rate and regular rhythm.  Pulmonary:     Effort: Pulmonary effort is normal. No respiratory distress.     Breath sounds: No wheezing, rhonchi or  rales.  Musculoskeletal:     Cervical back: Normal range of motion. No rigidity.  Lymphadenopathy:     Cervical: No cervical adenopathy.  Skin:    General: Skin is warm and dry.     Coloration: Skin is not cyanotic or pale.     Findings: No rash.  Neurological:     Mental Status: He is alert and oriented to person, place, and time.  Psychiatric:        Behavior: Behavior is cooperative.     UC Treatments / Results  Labs (all labs ordered are listed, but only abnormal results are displayed) Labs Reviewed - No data to display  EKG   Radiology No results found.  Procedures Procedures (including critical care time)  Medications Ordered in UC Medications - No data to display  Initial Impression / Assessment and Plan / UC Course  I have reviewed the triage vital signs and the nursing notes.  Pertinent labs & imaging results that were available during my care of the patient were reviewed by me and considered in my medical decision making (see chart for details).   Patient is well-appearing, afebrile, not tachycardic, not tachypneic, oxygenating well on room air.  He is mildly hypertensive in urgent care today; likely secondary to running out of blood pressure medication.  1. Acute nonintractable headache, unspecified headache type 2. Encounter for medication refill 3. Essential hypertension Suspect headache is secondary to elevated blood pressure/uncontrolled hypertension Resume losartan Recommended close follow-up with PCP in next 1 to 2 weeks and primary care assistance initiated No red flags in history or on exam today, although patient is hypertensive Strict ER precautions discussed with patient  4. History of gout Patient does not appear to be in an acute flare today Will hold off on treatment until he establishes care with primary care provider  The patient was given the opportunity to ask questions.  All questions answered to their satisfaction.  The patient is in  agreement to this plan.    Final Clinical Impressions(s) / UC Diagnoses   Final diagnoses:  Acute nonintractable headache, unspecified headache type  Encounter for medication refill  Essential hypertension     Discharge Instructions      As we discussed, please resume the losartan to treat  your blood pressure.  This is likely causing your headache.  If you have chest pain, shortness of breath, lightheadedness, or dizziness in the meantime, please go to the emergency room.  Please follow-up with your primary care provider within the next 1 to 2 weeks-I have initiated primary care assistance and you can call around to get an appointment.   ED Prescriptions     Medication Sig Dispense Auth. Provider   losartan (COZAAR) 50 MG tablet Take 1 tablet (50 mg total) by mouth daily. 30 tablet Eulogio Bear, NP      PDMP not reviewed this encounter.   Eulogio Bear, NP 11/01/22 [phone]

## 2022-11-01 NOTE — ED Triage Notes (Signed)
Patient reports that he has been having headaches and hypertension. Patient states he has been out of his BP med/Losartan 50 mg x 2 weeks. Patient also c/o right foot pain and reports a history of gout.

## 2022-11-01 NOTE — Discharge Instructions (Signed)
As we discussed, please resume the losartan to treat your blood pressure.  This is likely causing your headache.  If you have chest pain, shortness of breath, lightheadedness, or dizziness in the meantime, please go to the emergency room.  Please follow-up with your primary care provider within the next 1 to 2 weeks-I have initiated primary care assistance and you can call around to get an appointment.

## 2022-11-17 ENCOUNTER — Other Ambulatory Visit: Payer: Self-pay

## 2022-11-17 ENCOUNTER — Emergency Department (HOSPITAL_BASED_OUTPATIENT_CLINIC_OR_DEPARTMENT_OTHER)
Admission: EM | Admit: 2022-11-17 | Discharge: 2022-11-17 | Disposition: A | Discharge status: Home / Self Care | Payer: BLUE CROSS/BLUE SHIELD | Attending: Emergency Medicine | Admitting: Emergency Medicine

## 2022-11-17 ENCOUNTER — Other Ambulatory Visit (HOSPITAL_BASED_OUTPATIENT_CLINIC_OR_DEPARTMENT_OTHER): Payer: Self-pay

## 2022-11-17 ENCOUNTER — Encounter (HOSPITAL_BASED_OUTPATIENT_CLINIC_OR_DEPARTMENT_OTHER): Payer: Self-pay | Admitting: Emergency Medicine

## 2022-11-17 DIAGNOSIS — J020 Streptococcal pharyngitis: Secondary | ICD-10-CM | POA: Diagnosis not present

## 2022-11-17 DIAGNOSIS — I10 Essential (primary) hypertension: Secondary | ICD-10-CM | POA: Insufficient documentation

## 2022-11-17 DIAGNOSIS — Z1152 Encounter for screening for COVID-19: Secondary | ICD-10-CM | POA: Diagnosis not present

## 2022-11-17 DIAGNOSIS — J029 Acute pharyngitis, unspecified: Secondary | ICD-10-CM | POA: Diagnosis present

## 2022-11-17 LAB — GROUP A STREP BY PCR: Group A Strep by PCR: DETECTED — AB

## 2022-11-17 LAB — SARS CORONAVIRUS 2 BY RT PCR: SARS Coronavirus 2 by RT PCR: NEGATIVE

## 2022-11-17 MED ORDER — ACETAMINOPHEN 325 MG PO TABS
650.0000 mg | ORAL_TABLET | Freq: Once | ORAL | Status: AC | PRN
  Administered 2022-11-17: 650 mg via ORAL
  Filled 2022-11-17: qty 2

## 2022-11-17 MED ORDER — AMOXICILLIN 500 MG PO CAPS
1000.0000 mg | ORAL_CAPSULE | Freq: Every day | ORAL | 0 refills | Status: AC
  Filled 2022-11-17: qty 20, 10d supply, fill #0

## 2022-11-17 MED ORDER — NAPROXEN 500 MG PO TABS
500.0000 mg | ORAL_TABLET | Freq: Two times a day (BID) | ORAL | 0 refills | Status: AC
  Filled 2022-11-17: qty 20, 10d supply, fill #0

## 2022-11-17 NOTE — Discharge Instructions (Signed)
Please read and follow all provided instructions.  Your diagnoses today include:  1. Strep pharyngitis     Tests performed today include: Strep test: was POSITIVE for strep throat Flu, COVID, RSV: you will be notified with any positive results Vital signs. See below for your results today.   Medications prescribed:  Amoxicillin - antibiotic  You have been prescribed an antibiotic medicine: take the entire course of medicine even if you are feeling better. Stopping early can cause the antibiotic not to work.  Naproxen - anti-inflammatory pain medication Do not exceed 500mg  naproxen every 12 hours, take with food  You have been prescribed an anti-inflammatory medication or NSAID. Take with food. Take smallest effective dose for the shortest duration needed for your pain. Stop taking if you experience stomach pain or vomiting.   Take any medications prescribed only as directed.   Home care instructions:  Please read the educational materials provided and follow any instructions contained in this packet.  Follow-up instructions: Please follow-up with your primary care provider as needed for further evaluation of your symptoms.  Return instructions:  Please return to the Emergency Department if you experience worsening symptoms.  Return if you have worsening problems swallowing, your neck becomes swollen, you cannot swallow your saliva or your voice becomes muffled.  Return with high persistent fever, persistent vomiting, or if you have trouble breathing.  Please return if you have any other emergent concerns.  Additional Information:  Your vital signs today were: BP (!) 131/96   Pulse 98   Temp 100 F (37.8 C) (Oral)   Resp 18   Wt 119.7 kg   SpO2 97%   BMI 35.80 kg/m  If your blood pressure (BP) was elevated above 135/85 this visit, please have this repeated by your doctor within one month. --------------

## 2022-11-17 NOTE — ED Provider Notes (Signed)
[facility] Provider Note   CSN: [id number] Arrival date & time: 11/17/22  1443     History  Chief Complaint  Patient presents with   Sore Throat    Jeffery Acosta is a 37 y.o. male.  Patient presents to the emergency department today for evaluation of sore throat, nasal congestion, generalized fatigue and weakness, body aches and headache which started yesterday but worsened last night.  He states that he was around somebody about a week ago who had COVID.  He has had nausea but no vomiting.  To me, he denies fever or shaking chills.  He denies significant cough.  Reports only other chronic medical condition is high blood pressure.  No diarrhea or urinary symptoms reported.       Home Medications Prior to Admission medications   Medication Sig Start Date End Date Taking? Authorizing Provider  losartan (COZAAR) 50 MG tablet Take 1 tablet (50 mg total) by mouth daily. 11/01/22   Eulogio Bear, NP      Allergies    Patient has no known allergies.    Review of Systems   Review of Systems  Physical Exam Updated Vital Signs BP (!) 131/96   Pulse 98   Temp 100 F (37.8 C) (Oral)   Resp 18   Wt 119.7 kg   SpO2 97%   BMI 35.80 kg/m  Physical Exam Vitals and nursing note reviewed.  Constitutional:      Appearance: He is well-developed.  HENT:     Head: Normocephalic and atraumatic.     Jaw: No trismus.     Right Ear: Tympanic membrane, ear canal and external ear normal. No tenderness.     Left Ear: Tympanic membrane, ear canal and external ear normal. No tenderness.     Nose: Congestion present. No mucosal edema or rhinorrhea.     Mouth/Throat:     Mouth: Mucous membranes are not dry.     Pharynx: Uvula midline. Posterior oropharyngeal erythema present. No oropharyngeal exudate or uvula swelling.     Tonsils: No tonsillar abscesses.  Eyes:     General:        Right eye: No discharge.        Left eye: No discharge.      Conjunctiva/sclera: Conjunctivae normal.  Cardiovascular:     Rate and Rhythm: Normal rate and regular rhythm.     Heart sounds: Normal heart sounds.  Pulmonary:     Effort: Pulmonary effort is normal. No respiratory distress.     Breath sounds: Normal breath sounds. No wheezing or rales.  Abdominal:     Palpations: Abdomen is soft.     Tenderness: There is no abdominal tenderness.  Musculoskeletal:     Cervical back: Normal range of motion and neck supple.  Skin:    General: Skin is warm and dry.  Neurological:     Mental Status: He is alert.     ED Results / Procedures / Treatments   Labs (all labs ordered are listed, but only abnormal results are displayed) Labs Reviewed  GROUP A STREP BY PCR - Abnormal; Notable for the following components:      Result Value   Group A Strep by PCR DETECTED (*)    All other components within normal limits  SARS CORONAVIRUS 2 BY RT PCR    EKG None  Radiology No results found.  Procedures Procedures    Medications Ordered in ED Medications  acetaminophen (TYLENOL)  tablet 650 mg (650 mg Oral Given 11/17/22 1516)    ED Course/ Medical Decision Making/ A&P    Patient seen and examined. History obtained directly from patient.   Labs/EKG: Ordered COVID, flu, RSV, strep testing.  Imaging: None ordered  Medications/Fluids: Ordered: Tylenol by RN protocol  Most recent vital signs reviewed and are as follows: BP (!) 131/96   Pulse 98   Temp 100 F (37.8 C) (Oral)   Resp 18   Wt 119.7 kg   SpO2 97%   BMI 35.80 kg/m   Initial impression: URI with sore throat  3:53 PM Reassessment performed. Patient appears stable.  Labs personally reviewed and interpreted including: Strep positive.  Respiratory panel pending, will notify patient with any positive results.  Reviewed pertinent lab work and imaging with patient at bedside. Questions answered.   Most current vital signs reviewed and are as follows: BP (!) 131/96   Pulse  98   Temp 100 F (37.8 C) (Oral)   Resp 18   Wt 119.7 kg   SpO2 97%   BMI 35.80 kg/m   Plan: Discharge to home.   Prescriptions written for: Patient amoxicillin, naproxen  Other home care instructions discussed: Maintain good hydration  ED return instructions discussed: Worsening difficulty swallowing, high fevers, new symptoms or other concerns  Follow-up instructions discussed: Patient encouraged to follow-up with their PCP in 5 days if not improving.                               Medical Decision Making Risk OTC drugs. Prescription drug management.   In regards to the patient's sore throat today, the following dangerous and potentially life threatening etiologies were considered on the differential diagnosis: Lugwig's angina, uvulitis, epiglottis, peritonsillar abscess, retropharyngeal abscess, Lemierre's syndrome. Also considered were more common causes such as: streptococcal pharyngitis, gonococcal pharyngitis, non-bacterial pharyngitis (cold viruses, HSV/coxsackievirus, influenza, COVID-19, infectious mononucleosis, oropharyngeal candidiasis), and other non-infectious causes including seasonal allergies/post-nasal drip, GERD/esophagitis, trauma.   The patient's vital signs, pertinent lab work and imaging were reviewed and interpreted as discussed in the ED course. Hospitalization was considered for further testing, treatments, or serial exams/observation. However as patient is well-appearing, has a stable exam, and reassuring studies today, I do not feel that they warrant admission at this time. This plan was discussed with the patient who verbalizes agreement and comfort with this plan and seems reliable and able to return to the Emergency Department with worsening or changing symptoms.          Final Clinical Impression(s) / ED Diagnoses Final diagnoses:  Strep pharyngitis    Rx / DC Orders ED Discharge Orders          Ordered    amoxicillin (AMOXIL) 500 MG  capsule  Daily        11/17/22 1550    naproxen (NAPROSYN) 500 MG tablet  2 times daily        11/17/22 1550              Carlisle Cater, PA-C 11/17/22 1554    Wyvonnia Dusky, MD 11/17/22 [phone]

## 2022-11-17 NOTE — ED Triage Notes (Signed)
Pt arrives pov, steady gait with c/o HA, body aches, fever, sore throat and feeling fatigued x 2days. Family member recent dx with covid. Also reports nausea

## 2022-12-31 ENCOUNTER — Emergency Department (HOSPITAL_BASED_OUTPATIENT_CLINIC_OR_DEPARTMENT_OTHER): Payer: BLUE CROSS/BLUE SHIELD

## 2022-12-31 ENCOUNTER — Other Ambulatory Visit: Payer: Self-pay

## 2022-12-31 ENCOUNTER — Emergency Department (HOSPITAL_BASED_OUTPATIENT_CLINIC_OR_DEPARTMENT_OTHER)
Admission: EM | Admit: 2022-12-31 | Discharge: 2022-12-31 | Disposition: A | Discharge status: Home / Self Care | Payer: BLUE CROSS/BLUE SHIELD | Attending: Emergency Medicine | Admitting: Emergency Medicine

## 2022-12-31 DIAGNOSIS — Z79899 Other long term (current) drug therapy: Secondary | ICD-10-CM | POA: Insufficient documentation

## 2022-12-31 DIAGNOSIS — Z86718 Personal history of other venous thrombosis and embolism: Secondary | ICD-10-CM | POA: Insufficient documentation

## 2022-12-31 DIAGNOSIS — M79672 Pain in left foot: Secondary | ICD-10-CM | POA: Diagnosis present

## 2022-12-31 DIAGNOSIS — I10 Essential (primary) hypertension: Secondary | ICD-10-CM | POA: Insufficient documentation

## 2022-12-31 LAB — CBC
HCT: 46.9 % (ref 39.0–52.0)
Hemoglobin: 16 g/dL (ref 13.0–17.0)
MCH: 32 pg (ref 26.0–34.0)
MCHC: 34.1 g/dL (ref 30.0–36.0)
MCV: 93.8 fL (ref 80.0–100.0)
Platelets: 228 10*3/uL (ref 150–400)
RBC: 5 MIL/uL (ref 4.22–5.81)
RDW: 14.3 % (ref 11.5–15.5)
WBC: 10.6 10*3/uL — ABNORMAL HIGH (ref 4.0–10.5)
nRBC: 0 % (ref 0.0–0.2)

## 2022-12-31 LAB — COMPREHENSIVE METABOLIC PANEL
ALT: 17 U/L (ref 0–44)
AST: 17 U/L (ref 15–41)
Albumin: 4.2 g/dL (ref 3.5–5.0)
Alkaline Phosphatase: 60 U/L (ref 38–126)
Anion gap: 8 (ref 5–15)
BUN: 10 mg/dL (ref 6–20)
CO2: 26 mmol/L (ref 22–32)
Calcium: 9.3 mg/dL (ref 8.9–10.3)
Chloride: 105 mmol/L (ref 98–111)
Creatinine, Ser: 1.27 mg/dL — ABNORMAL HIGH (ref 0.61–1.24)
GFR, Estimated: >60 mL/min (ref >60)
Glucose, Bld: 139 mg/dL — ABNORMAL HIGH (ref 70–99)
Potassium: 3.8 mmol/L (ref 3.5–5.1)
Sodium: 139 mmol/L (ref 135–145)
Total Bilirubin: 0.5 mg/dL (ref 0.3–1.2)
Total Protein: 7 g/dL (ref 6.5–8.1)

## 2022-12-31 LAB — URIC ACID: Uric Acid, Serum: 6.7 mg/dL (ref 3.7–8.6)

## 2022-12-31 LAB — SEDIMENTATION RATE: Sed Rate: 1 mm/hr (ref 0–16)

## 2022-12-31 MED ORDER — PREDNISONE 10 MG (21) PO TBPK: ORAL_TABLET | Freq: Every day | ORAL | 0 refills | Status: AC

## 2022-12-31 MED ORDER — INDOMETHACIN 50 MG PO CAPS: 50.0000 mg | ORAL_CAPSULE | Freq: Two times a day (BID) | ORAL | 0 refills | Status: DC

## 2022-12-31 MED ORDER — PREDNISONE 10 MG (21) PO TBPK: ORAL_TABLET | Freq: Every day | ORAL | 0 refills | Status: DC

## 2022-12-31 MED ORDER — COLCHICINE 0.6 MG PO TABS: 0.6000 mg | ORAL_TABLET | Freq: Every day | ORAL | 1 refills | Status: DC

## 2022-12-31 MED ORDER — COLCHICINE 0.6 MG PO TABS: 0.6000 mg | ORAL_TABLET | Freq: Every day | ORAL | 1 refills | Status: AC

## 2022-12-31 MED ORDER — INDOMETHACIN 50 MG PO CAPS: 50.0000 mg | ORAL_CAPSULE | Freq: Three times a day (TID) | ORAL | 0 refills | Status: AC

## 2022-12-31 MED ORDER — ACETAMINOPHEN 500 MG PO TABS
1000.0000 mg | ORAL_TABLET | Freq: Once | ORAL | Status: AC
  Administered 2022-12-31: 1000 mg via ORAL
  Filled 2022-12-31: qty 2

## 2022-12-31 NOTE — ED Provider Notes (Signed)
[facility] EMERGENCY DEPARTMENT AT [facility] Provider Note   CSN: [id number] Arrival date & time: 12/31/22  1640     History  Chief Complaint  Patient presents with   Foot Pain    Jeffery Acosta is a 37 y.o. male with a past medical history of DVT, gout, hypertension presents today for evaluation of foot pain.  Patient reports he has had left foot pain and swelling the last 4 days.  The pain is located on the top and on the side of the left foot.  He denies any recent fall or injury to his foot.  States the pain is somewhat different to his gout attack.  Patient is ambulatory and is able to bear weight on left foot.  Denies any fever, nausea, vomiting, rash.  History of DVT not currently on any anticoagulants.  Denies any pain on the left calf or thigh.  No recent travel or surgery.   Foot Pain      Past Medical History:  Diagnosis Date   DVT (deep venous thrombosis) (HCC)    Fracture    angulated 4th and 5th digit   Gout    Headache    Hx of blood clots    " superficial blood clots" per pt   Hypertension    Wears glasses    Past Surgical History:  Procedure Laterality Date   OPEN REDUCTION INTERNAL FIXATION (ORIF) PROXIMAL PHALANX Right 05/08/2020   Procedure: Right ring finger proximal phalanx fracture open reduction and internal fixation;  Surgeon: Ernest Mallick, MD;  Location: [facility];  Service: Orthopedics;  Laterality: Right;   WISDOM TOOTH EXTRACTION       Home Medications Prior to Admission medications   Medication Sig Start Date End Date Taking? Authorizing Provider  colchicine 0.6 MG tablet Take 1 tablet (0.6 mg total) by mouth daily. 12/31/22  Yes Jeanelle Malling, PA  indomethacin (INDOCIN) 50 MG capsule Take 1 capsule (50 mg total) by mouth 2 (two) times daily with a meal. 12/31/22  Yes Jeanelle Malling, PA  losartan (COZAAR) 50 MG tablet Take 1 tablet (50 mg total) by mouth daily. 11/01/22   Valentino Nose, NP  naproxen (NAPROSYN) 500  MG tablet Take 1 tablet (500 mg total) by mouth 2 (two) times daily. 11/17/22   Renne Crigler, PA-C  predniSONE (STERAPRED UNI-PAK 21 TAB) 10 MG (21) TBPK tablet Take by mouth daily. Take 6 tabs by mouth daily  for 2 days, then 5 tabs for 2 days, then 4 tabs for 2 days, then 3 tabs for 2 days, 2 tabs for 2 days, then 1 tab by mouth daily for 2 days 12/31/22  Yes Jeanelle Malling, PA      Allergies    Patient has no known allergies.    Review of Systems   Review of Systems Negative except as per HPI.  Physical Exam Updated Vital Signs BP (!) 153/121 (BP Location: Right Arm)   Pulse (!) 105   Temp 98.1 F (36.7 C)   Resp 16   Ht 6' (1.829 m)   Wt 116.6 kg   SpO2 96%   BMI 34.86 kg/m  Physical Exam Vitals and nursing note reviewed.  Constitutional:      Appearance: Normal appearance.  HENT:     Head: Normocephalic and atraumatic.     Mouth/Throat:     Mouth: Mucous membranes are moist.  Eyes:     General: No scleral icterus. Cardiovascular:  Rate and Rhythm: Normal rate and regular rhythm.     Pulses: Normal pulses.     Heart sounds: Normal heart sounds.  Pulmonary:     Effort: Pulmonary effort is normal.     Breath sounds: Normal breath sounds.  Abdominal:     General: Abdomen is flat.     Palpations: Abdomen is soft.     Tenderness: There is no abdominal tenderness.  Musculoskeletal:        General: No deformity.     Comments: Swelling noted to the left foot with tenderness to palpation.  Skin:    General: Skin is warm.     Findings: No rash.  Neurological:     General: No focal deficit present.     Mental Status: He is alert.  Psychiatric:        Mood and Affect: Mood normal.     ED Results / Procedures / Treatments   Labs (all labs ordered are listed, but only abnormal results are displayed) Labs Reviewed - No data to display  EKG None  Radiology US Venous Img Lower Unilateral Left  Result Date: 12/31/2022 CLINICAL DATA:  Left lower leg and foot pain and  swelling for 4 days. History of gout. EXAM: Left LOWER EXTREMITY VENOUS DOPPLER ULTRASOUND TECHNIQUE: Gray-scale sonography with compression, as well as color and duplex ultrasound, were performed to evaluate the deep venous system(s) from the level of the common femoral vein through the popliteal and proximal calf veins. COMPARISON:  None Available. FINDINGS: VENOUS Normal compressibility of the common femoral, superficial femoral, and popliteal veins, as well as the visualized calf veins. Visualized portions of profunda femoral vein and great saphenous vein unremarkable. No filling defects to suggest DVT on grayscale or color Doppler imaging. Doppler waveforms show normal direction of venous flow, normal respiratory plasticity and response to augmentation. Limited views of the contralateral common femoral vein are unremarkable. OTHER None. Limitations: none IMPRESSION: No evidence of left lower extremity DVT Electronically Signed   By: Karen Kays M.D.   On: 12/31/2022 18:56    Procedures Procedures    Medications Ordered in ED Medications - No data to display  ED Course/ Medical Decision Making/ A&P Clinical Course as of 12/31/22 2202  Fri Dec 31, 2022  1948 Stable left foot pain  [CC]  1954 Evaluated at bedside.  Atypical presentation likely bursitis.  Will treat for pain in the emergency room, evaluate for gout with screening labs and x-ray for underlying traumatic injury.  Likely treatment for inflammatory bursitis. [CC]  2155 Acute gout rule 13 [CC]    Clinical Course User Index [CC] Glyn Ade, MD                             Medical Decision Making Amount and/or Complexity of Data Reviewed Labs: ordered. Radiology: ordered.  Risk OTC drugs. Prescription drug management.   This patient presents to the ED for left foot pain, this involves an extensive number of treatment options, and is a complaint that carries with a high risk of complications and morbidity.  The  differential diagnosis includes fracture, dislocation, ligamentous injury, gout flare, pseudogout, infectious etiology.  This is not an exhaustive list.  Lab tests: I ordered and personally interpreted labs.  The pertinent results include: WBC 10.6. Hbg unremarkable. Platelets unremarkable. Electrolytes unremarkable. BUN, creatinine 1.27.  Uric acid 6.7.  Sed rate 1.  Imaging studies: I ordered imaging studies. I personally reviewed,  interpreted imaging and agree with the radiologist's interpretations. The results include: Ultrasound left leg showed no evidence of DVT.  X-ray of left foot showed no acute osseous abnormality.  Problem list/ ED course/ Critical interventions/ Medical management: HPI: See above Vital signs within normal range and stable throughout visit. Laboratory/imaging studies significant for: See above. On physical examination, patient is afebrile and appears in no acute distress. Patient presents with left foot pain. Given history, exam and workup patient likely has acute gout flare vs musculoskeletal pain. I have low suspicion for fracture, dislocation, significant ligamentous injury, septic arthritis, new autoimmune arthropathy, or gonococcal arthropathy.  Low suspicion for DVT based on ultrasound results. I will send an Rx of indomethacin, colchicine and steroid taper. Advised patient to take the medications as prescribed for pain, follow-up with primary care physician for further evaluation and management, return to the ER if new or worsening symptoms.   I have reviewed the patient home medicines and have made adjustments as needed.  Cardiac monitoring/EKG: The patient was maintained on a cardiac monitor.  I personally reviewed and interpreted the cardiac monitor which showed an underlying rhythm of: sinus rhythm.  Additional history obtained: External records from outside source obtained and reviewed including: Chart review including previous notes, labs,  imaging.  Consultations obtained:  Disposition Continued outpatient therapy. Follow-up with PCP recommended for reevaluation of symptoms. Treatment plan discussed with patient.  Pt acknowledged understanding was agreeable to the plan. Worrisome signs and symptoms were discussed with patient, and patient acknowledged understanding to return to the ED if they noticed these signs and symptoms. Patient was stable upon discharge.   This chart was dictated using voice recognition software.  Despite best efforts to proofread,  errors can occur which can change the documentation meaning.          Final Clinical Impression(s) / ED Diagnoses Final diagnoses:  Left foot pain    Rx / DC Orders ED Discharge Orders          Ordered    indomethacin (INDOCIN) 50 MG capsule  2 times daily with meals        12/31/22 1947    colchicine 0.6 MG tablet  Daily        12/31/22 1947    predniSONE (STERAPRED UNI-PAK 21 TAB) 10 MG (21) TBPK tablet  Daily        12/31/22 1947              Jeanelle Malling, PA 12/31/22 2208    Glyn Ade, MD 01/03/23 [phone]

## 2022-12-31 NOTE — ED Notes (Signed)
Report received from [place], [place]. Patient resting quietly in stretcher, respirations even, unlabored, no acute distress noted. Denies needs at this time.  Awaiting lab results.

## 2022-12-31 NOTE — Discharge Instructions (Addendum)
Please take your medications as prescribed. Take tylenol/ibuprofen for pain. I recommend close follow-up with PCP for reevaluation.  Please do not hesitate to return to emergency department if worrisome signs symptoms we discussed become apparent.  

## 2022-12-31 NOTE — ED Notes (Signed)
Reviewed AVS with patient, patient expressed understanding of directions, denies further questions at this time. 

## 2022-12-31 NOTE — ED Triage Notes (Signed)
Pt arrived POV from home, caox4, ambulatory c/o L foot pain and swelling x4 days. Denies trauma/injury. Hx gout.

## 2023-01-20 ENCOUNTER — Ambulatory Visit: Payer: BLUE CROSS/BLUE SHIELD | Admitting: Family Medicine

## 2024-04-14 ENCOUNTER — Emergency Department (HOSPITAL_COMMUNITY)
Admission: EM | Admit: 2024-04-14 | Discharge: 2024-04-15 | Disposition: A | Discharge status: Home / Self Care | Payer: Self-pay | Attending: Emergency Medicine | Admitting: Emergency Medicine

## 2024-04-14 DIAGNOSIS — Y906 Blood alcohol level of 120-199 mg/100 ml: Secondary | ICD-10-CM | POA: Insufficient documentation

## 2024-04-14 DIAGNOSIS — F10920 Alcohol use, unspecified with intoxication, uncomplicated: Secondary | ICD-10-CM | POA: Insufficient documentation

## 2024-04-14 LAB — BLOOD GAS, VENOUS
Acid-base deficit: 1.8 mmol/L (ref 0.0–2.0)
Bicarbonate: 24.7 mmol/L (ref 20.0–28.0)
O2 Saturation: 94.3 %
Patient temperature: 37
pCO2, Ven: 48 mmHg (ref 44–60)
pH, Ven: 7.32 (ref 7.25–7.43)
pO2, Ven: 75 mmHg — ABNORMAL HIGH (ref 32–45)

## 2024-04-14 LAB — CBC WITH DIFFERENTIAL/PLATELET
Abs Immature Granulocytes: 0.03 K/uL (ref 0.00–0.07)
Basophils Absolute: 0.1 K/uL (ref 0.0–0.1)
Basophils Relative: 1 %
Eosinophils Absolute: 0.1 K/uL (ref 0.0–0.5)
Eosinophils Relative: 1 %
HCT: 44.1 % (ref 39.0–52.0)
Hemoglobin: 14 g/dL (ref 13.0–17.0)
Immature Granulocytes: 0 %
Lymphocytes Relative: 28 %
Lymphs Abs: 2.3 K/uL (ref 0.7–4.0)
MCH: 28.3 pg (ref 26.0–34.0)
MCHC: 31.7 g/dL (ref 30.0–36.0)
MCV: 89.3 fL (ref 80.0–100.0)
Monocytes Absolute: 0.5 K/uL (ref 0.1–1.0)
Monocytes Relative: 7 %
Neutro Abs: 5.1 K/uL (ref 1.7–7.7)
Neutrophils Relative %: 63 %
Platelets: 140 K/uL — ABNORMAL LOW (ref 150–400)
RBC: 4.94 MIL/uL (ref 4.22–5.81)
RDW: 14.6 % (ref 11.5–15.5)
WBC: 8.1 K/uL (ref 4.0–10.5)
nRBC: 0 % (ref 0.0–0.2)

## 2024-04-14 LAB — CBG MONITORING, ED: Glucose-Capillary: 122 mg/dL — ABNORMAL HIGH (ref 70–99)

## 2024-04-14 MED ORDER — SODIUM CHLORIDE 0.9 % IV BOLUS
1000.0000 mL | Freq: Once | INTRAVENOUS | Status: AC
  Administered 2024-04-14: 1000 mL via INTRAVENOUS

## 2024-04-14 MED ORDER — NALOXONE HCL 2 MG/2ML IJ SOSY
PREFILLED_SYRINGE | INTRAMUSCULAR | Status: AC
  Filled 2024-04-14: qty 2

## 2024-04-14 MED ORDER — SODIUM CHLORIDE 0.9 % IV SOLN: INTRAVENOUS | Status: DC

## 2024-04-14 NOTE — ED Triage Notes (Addendum)
 Pt came in via EMS from a pub unresponsive. Snoring respirations, and pinpoint pupils. Minimally responsive to sternum rub. Friend possibly said he could have taken some pills.  No narcan  w/ EMS.

## 2024-04-14 NOTE — ED Notes (Signed)
 Another 1 mg of narcan  given. No response.

## 2024-04-14 NOTE — ED Provider Notes (Signed)
 [place] EMERGENCY DEPARTMENT AT [facility] Provider Note  CSN: [id number] Arrival date & time: 04/14/24 2258  Chief Complaint(s) unresponsive  Pt came in via EMS from a pub unresponsive. Snoring respirations, and pinpoint pupils. Minimally responsive to sternum rub. Friend possibly said he could have taken some pills.  No narcan  w/ EMS.  HPI Jeffery Acosta is a 38 y.o. male  found to be unresponsive at a local pub.  The history is provided by the EMS personnel.    Past Medical History No past medical history on file. There are no active problems to display for this patient.  Home Medication(s) Prior to Admission medications   Not on File                                                                                                                                    Allergies Patient has no allergy information on record.  Review of Systems Review of Systems As noted in HPI  Physical Exam Vital Signs  I have reviewed the triage vital signs BP (!) 142/98   Pulse 89   Temp 97.7 F (36.5 C) (Axillary)   Resp 14   Wt (!) 137.6 kg   SpO2 99%   Physical Exam Vitals reviewed.  Constitutional:      General: He is not in acute distress.    Appearance: He is well-developed. He is not diaphoretic.  HENT:     Head: Normocephalic and atraumatic.     Nose: Nose normal.  Eyes:     General: No scleral icterus.       Right eye: No discharge.        Left eye: No discharge.     Conjunctiva/sclera: Conjunctivae normal.     Pupils: Pupils are equal, round, and reactive to light.  Cardiovascular:     Rate and Rhythm: Normal rate and regular rhythm.     Heart sounds: No murmur heard.    No friction rub. No gallop.  Pulmonary:     Effort: Pulmonary effort is normal. No respiratory distress.     Breath sounds: Normal breath sounds. No stridor. No rales.  Abdominal:     General: There is no distension.     Palpations: Abdomen is soft.     Tenderness: There is no  abdominal tenderness.  Musculoskeletal:        General: No tenderness.     Cervical back: Normal range of motion and neck supple.  Skin:    General: Skin is warm and dry.     Findings: No erythema or rash.  Neurological:     Mental Status: He is lethargic.     GCS: GCS eye subscore is 2. GCS verbal subscore is 4. GCS motor subscore is 5.     Comments: Awakens to splashing water on his face. Moves all extremities with good strength      ED Results and Treatments  Labs (all labs ordered are listed, but only abnormal results are displayed) Labs Reviewed  COMPREHENSIVE METABOLIC PANEL WITH GFR - Abnormal; Notable for the following components:      Result Value   Glucose, Bld 117 (*)    Calcium 8.3 (*)    Total Protein 6.0 (*)    Albumin 3.3 (*)    GFR, Estimated 39 (*)    All other components within normal limits  SALICYLATE LEVEL - Abnormal; Notable for the following components:   Salicylate Lvl <7.0 (*)    All other components within normal limits  ACETAMINOPHEN  LEVEL - Abnormal; Notable for the following components:   Acetaminophen  (Tylenol ), Serum <10 (*)    All other components within normal limits  ETHANOL - Abnormal; Notable for the following components:   Alcohol, Ethyl (B) 186 (*)    All other components within normal limits  CBC WITH DIFFERENTIAL/PLATELET - Abnormal; Notable for the following components:   Platelets 140 (*)    All other components within normal limits  BLOOD GAS, VENOUS - Abnormal; Notable for the following components:   pO2, Ven 75 (*)    All other components within normal limits  CBG MONITORING, ED - Abnormal; Notable for the following components:   Glucose-Capillary 122 (*)    All other components within normal limits  RAPID URINE DRUG SCREEN, HOSP PERFORMED                                                                                                                         EKG   Radiology No results found.  Medications Ordered in  ED Medications  sodium chloride  0.9 % bolus 1,000 mL (0 mLs Intravenous Stopped 04/15/24 0100)    And  0.9 %  sodium chloride  infusion (0 mLs Intravenous Stopped 04/15/24 0147)  naloxone  (NARCAN ) 2 MG/2ML injection ( Injection Given 04/14/24 2306)   Procedures Procedures  (including critical care time) Medical Decision Making / ED Course   Medical Decision Making Amount and/or Complexity of Data Reviewed Labs: ordered. Decision-making details documented in ED Course. ECG/medicine tests: ordered and independent interpretation performed. Decision-making details documented in ED Course.  Risk Prescription drug management.     Clinical Course as of 04/15/24 9297  Sat Apr 14, 2024  2315 Patient brought in [place] park.  He is hemodynamically stable.  Responding to noxious stimuli.  Moving all extremities with good strength.  No signs of trauma noted on exam.  Likely intoxication.  Patient did not respond to 2 mg of Narcan .  Labs and IV fluid ordered. [PC]  Sun Apr 15, 2024  0035 Labs reassuring without leukocytosis or anemia.  No significant electrolyte derangements or renal insufficiency.  No evidence of hypercarbia.  CBC normal.  EtOH is elevated at 186. [PC]  0130 Patient is now awake. Admits to drinking.  Does not remember taking any pills.  Still intoxicated and will need additional metabolize to freedom [PC]  0700  Patient now awake and ambulating without complication.  Clinically sober and appropriate for discharge. [PC]    Clinical Course User Index [PC] Asa Fath, Raynell Moder, MD    Final Clinical Impression(s) / ED Diagnoses Final diagnoses:  Alcoholic intoxication without complication [facility])   The patient appears reasonably screened and/or stabilized for discharge and I doubt any other medical condition or other [facility] requiring further screening, evaluation, or treatment in the ED at this time. I have discussed the findings, Dx and Tx  plan with the patient/family who expressed understanding and agree(s) with the plan. Discharge instructions discussed at length. The patient/family was given strict return precautions who verbalized understanding of the instructions. No further questions at time of discharge.  Disposition: Discharge  Condition: Good  ED Discharge Orders     None         Follow Up: Primary care provider  Call  to schedule an appointment for close follow up    This chart was dictated using voice recognition software.  Despite best efforts to proofread,  errors can occur which can change the documentation meaning.    Trine Raynell Moder, MD 04/15/24 [phone]

## 2024-04-15 LAB — COMPREHENSIVE METABOLIC PANEL WITH GFR
ALT: 25 U/L (ref 0–44)
AST: 24 U/L (ref 15–41)
Albumin: 3.3 g/dL — ABNORMAL LOW (ref 3.5–5.0)
Alkaline Phosphatase: 41 U/L (ref 38–126)
Anion gap: 9 (ref 5–15)
BUN: 16 mg/dL (ref 6–20)
CO2: 22 mmol/L (ref 22–32)
Calcium: 8.3 mg/dL — ABNORMAL LOW (ref 8.9–10.3)
Chloride: 109 mmol/L (ref 98–111)
Creatinine, Ser: 1.23 mg/dL (ref 0.61–1.24)
GFR, Estimated: 39 mL/min — ABNORMAL LOW (ref >60)
Glucose, Bld: 117 mg/dL — ABNORMAL HIGH (ref 70–99)
Potassium: 3.5 mmol/L (ref 3.5–5.1)
Sodium: 140 mmol/L (ref 135–145)
Total Bilirubin: 0.6 mg/dL (ref 0.0–1.2)
Total Protein: 6 g/dL — ABNORMAL LOW (ref 6.5–8.1)

## 2024-04-15 LAB — SALICYLATE LEVEL: Salicylate Lvl: <7 mg/dL — ABNORMAL LOW (ref 7.0–30.0)

## 2024-04-15 LAB — ACETAMINOPHEN LEVEL: Acetaminophen (Tylenol), Serum: <10 ug/mL — ABNORMAL LOW (ref 10–30)

## 2024-04-15 LAB — ETHANOL: Alcohol, Ethyl (B): 186 mg/dL — ABNORMAL HIGH (ref <15)

## 2024-04-15 NOTE — ED Notes (Signed)
 Pt ambulated to restroom with steady gait.

## 2024-04-15 NOTE — ED Notes (Signed)
 Pt walked to bathroom and back w/ minimal assistance. Attempted to get urine sample, pt does not know where he put cup. Stated  it is wherever it is.
